# Patient Record
Sex: Male | Born: 1950 | Race: White | Hispanic: No | Marital: Married | Smoking: Never smoker
Health system: Southern US, Community
[De-identification: ages and names within clinical notes are randomized; demographics above are authoritative.]

## PROBLEM LIST (undated history)

## (undated) DIAGNOSIS — L723 Sebaceous cyst: Secondary | ICD-10-CM

## (undated) DIAGNOSIS — Z9010 Acquired absence of unspecified breast and nipple: Secondary | ICD-10-CM

## (undated) DIAGNOSIS — J45909 Unspecified asthma, uncomplicated: Secondary | ICD-10-CM

## (undated) DIAGNOSIS — Z800 Family history of malignant neoplasm of digestive organs: Secondary | ICD-10-CM

## (undated) DIAGNOSIS — E7800 Pure hypercholesterolemia, unspecified: Secondary | ICD-10-CM

## (undated) DIAGNOSIS — R6889 Other general symptoms and signs: Secondary | ICD-10-CM

## (undated) DIAGNOSIS — H9190 Unspecified hearing loss, unspecified ear: Secondary | ICD-10-CM

## (undated) DIAGNOSIS — E785 Hyperlipidemia, unspecified: Secondary | ICD-10-CM

## (undated) DIAGNOSIS — Z8249 Family history of ischemic heart disease and other diseases of the circulatory system: Secondary | ICD-10-CM

## (undated) HISTORY — PX: TONSILLECTOMY: SUR1361

## (undated) HISTORY — DX: Unspecified asthma, uncomplicated: J45.909

## (undated) HISTORY — DX: Acquired absence of unspecified breast and nipple: Z90.10

## (undated) HISTORY — DX: Unspecified hearing loss, unspecified ear: H91.90

## (undated) HISTORY — PX: COLONOSCOPY: SHX174

## (undated) HISTORY — DX: Sebaceous cyst: L72.3

## (undated) HISTORY — DX: Other general symptoms and signs: R68.89

## (undated) HISTORY — PX: CYST REMOVAL HAND: SHX6279

## (undated) HISTORY — DX: Family history of ischemic heart disease and other diseases of the circulatory system: Z82.49

## (undated) HISTORY — DX: Family history of malignant neoplasm of digestive organs: Z80.0

## (undated) HISTORY — DX: Pure hypercholesterolemia, unspecified: E78.00

## (undated) HISTORY — PX: VASECTOMY: SHX75

## (undated) HISTORY — DX: Hyperlipidemia, unspecified: E78.5

---

## 1985-06-28 HISTORY — PX: INGUINAL HERNIA REPAIR: SUR1180

## 2000-01-08 ENCOUNTER — Encounter: Payer: Self-pay | Admitting: Surgery

## 2000-01-08 ENCOUNTER — Encounter: Admission: RE | Admit: 2000-01-08 | Discharge: 2000-01-08 | Payer: Self-pay | Admitting: Surgery

## 2000-01-29 ENCOUNTER — Ambulatory Visit (HOSPITAL_BASED_OUTPATIENT_CLINIC_OR_DEPARTMENT_OTHER): Admission: RE | Admit: 2000-01-29 | Discharge: 2000-01-29 | Payer: Self-pay | Admitting: *Deleted

## 2004-01-30 ENCOUNTER — Ambulatory Visit (HOSPITAL_COMMUNITY): Admission: RE | Admit: 2004-01-30 | Discharge: 2004-01-30 | Payer: Self-pay | Admitting: Gastroenterology

## 2013-07-12 ENCOUNTER — Encounter: Payer: Self-pay | Admitting: Internal Medicine

## 2013-07-19 ENCOUNTER — Telehealth: Payer: Self-pay | Admitting: Internal Medicine

## 2013-07-19 NOTE — Telephone Encounter (Signed)
lmom for pt to call back

## 2013-07-19 NOTE — Telephone Encounter (Signed)
Pt was answering the questions on his form prior to his PV and it asked if he'd had a COLON and he has. Pt saw Dr Earle Gell at [facility] for years and has had countless colonoscopies. His sister died of COLON Cancer when he was a boy and he used to get them Q3 years and then he was done Q5 years. Informed him I will send this to the [facility] nurse and to call for further questions.

## 2013-08-20 ENCOUNTER — Telehealth: Payer: Self-pay | Admitting: *Deleted

## 2013-08-20 ENCOUNTER — Encounter: Payer: Self-pay | Admitting: Internal Medicine

## 2013-08-20 ENCOUNTER — Ambulatory Visit (AMBULATORY_SURGERY_CENTER): Payer: Self-pay | Admitting: *Deleted

## 2013-08-20 VITALS — Wt 183.6 lb

## 2013-08-20 DIAGNOSIS — Z800 Family history of malignant neoplasm of digestive organs: Secondary | ICD-10-CM

## 2013-08-20 MED ORDER — PREPOPIK 10-3.5-12 MG-GM-GM PO PACK: PACK | ORAL | Status: DC

## 2013-08-20 NOTE — Telephone Encounter (Signed)
Patient is a direct colonoscopy on 09-03-13. 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 Patient states he was getting colonoscopies every 3 years but now it was changed to every 5 years. He states last colonoscopy was 5 years ago with Dr.Martin Wynetta Emery. Patient signed release of information form. Given to [facility].

## 2013-08-20 NOTE — Progress Notes (Signed)
Patient has had previous colonoscopies by Dr.Martin Wynetta Emery. Release of information form signed and given to Stacy,CMA.

## 2013-08-21 NOTE — Telephone Encounter (Signed)
Faxed record request to Dr. Earle Gell

## 2013-09-03 ENCOUNTER — Encounter: Payer: Self-pay | Admitting: Internal Medicine

## 2013-09-03 ENCOUNTER — Ambulatory Visit (AMBULATORY_SURGERY_CENTER): Payer: BC Managed Care – PPO | Admitting: Internal Medicine

## 2013-09-03 VITALS — BP 124/60 | HR 54 | Temp 96.5°F | Resp 11 | Ht 71.0 in | Wt 183.0 lb

## 2013-09-03 DIAGNOSIS — D126 Benign neoplasm of colon, unspecified: Secondary | ICD-10-CM

## 2013-09-03 DIAGNOSIS — Z860100 Personal history of colon polyps, unspecified: Secondary | ICD-10-CM

## 2013-09-03 DIAGNOSIS — Z800 Family history of malignant neoplasm of digestive organs: Secondary | ICD-10-CM

## 2013-09-03 DIAGNOSIS — Z1211 Encounter for screening for malignant neoplasm of colon: Secondary | ICD-10-CM

## 2013-09-03 MED ORDER — SODIUM CHLORIDE 0.9 % IV SOLN: 500.0000 mL | INTRAVENOUS | Status: DC

## 2013-09-03 NOTE — Progress Notes (Signed)
Called to room to assist during endoscopic procedure.  Patient ID and intended procedure confirmed with present staff. Received instructions for my participation in the procedure from the performing physician.  

## 2013-09-03 NOTE — Op Note (Signed)
[facility]  [address]. [place], [address]   COLONOSCOPY PROCEDURE REPORT  PATIENT: Jonathan Strickland, Jonathan Strickland  MR#: [id number] BIRTHDATE: 08/21/1950 , 29  yrs. old GENDER: Male ENDOSCOPIST: Jerene Bears, MD REFERRED BY:Hal Felipa Eth, M.D. PROCEDURE DATE:  09/03/2013 PROCEDURE:   Colonoscopy with snare polypectomy First Screening Colonoscopy - Avg.  risk and is 50 yrs.  old or older - No.  Prior Negative Screening - Now for repeat screening. N/A  History of Adenoma - Now for follow-up colonoscopy & has been > or = to 3 yrs.  Yes hx of adenoma.  Has been 3 or more years since last colonoscopy.  Polyps Removed Today? Yes. ASA CLASS:   Class II INDICATIONS:elevated risk screening, Patient's personal history of colon polyps, and Patient's family history of colon cancer, distant relatives. MEDICATIONS: MAC sedation, administered by CRNA and propofol (Diprivan) 350mg  IV  DESCRIPTION OF PROCEDURE:   After the risks benefits and alternatives of the procedure were thoroughly explained, informed consent was obtained.  A digital rectal exam revealed no rectal mass.   The [id number]  endoscope was introduced through the anus and advanced to the cecum, which was identified by both the appendix and ileocecal valve. No adverse events experienced. The quality of the prep was good, using MoviPrep  The instrument was then slowly withdrawn as the colon was fully examined.   COLON FINDINGS: Two sessile polyps measuring 5 and 7 mm in size were found in the transverse colon and descending colon.  Polypectomy was performed using cold snare.  All resections were complete and all polyp tissue was completely retrieved.   There was moderate diverticulosis noted in the ascending colon, at the hepatic flexure, in the descending colon, and sigmoid colon with associated muscular hypertrophy and angulation.  Retroflexed views revealed no abnormalities. The time to cecum=7 minutes 33  seconds.  Withdrawal time=20 minutes 13 seconds.  The scope was withdrawn and the procedure completed. COMPLICATIONS: There were no complications.  ENDOSCOPIC IMPRESSION: 1.   Two sessile polyps measuring 5 and 7 mm in size were found in the transverse colon and descending colon; Polypectomy was performed using cold snare 2.   There was moderate diverticulosis noted in the ascending colon, at the hepatic flexure, in the descending colon, and sigmoid colon  RECOMMENDATIONS: 1.  Await pathology results 2.  High fiber diet 3.  Timing of repeat colonoscopy will be determined by pathology findings, but no longer than 5 years. 4.  You will receive a letter within 1-2 weeks with the results of your biopsy as well as final recommendations.  Please call my office if you have not received a letter after 3 weeks.   eSigned:  Jerene Bears, MD 09/03/2013 10:42 AM   cc: The Patient and Lajean Manes, MD   PATIENT NAME:  Jonathan Strickland, Jonathan Strickland MR#: [id number]

## 2013-09-03 NOTE — Progress Notes (Signed)
Report to pacu rn, vss, bbs=clear 

## 2013-09-03 NOTE — Patient Instructions (Signed)
YOU HAD AN ENDOSCOPIC PROCEDURE TODAY AT THE [facility]: Refer to the procedure report that was given to you for any specific questions about what was found during the examination.  If the procedure report does not answer your questions, please call your gastroenterologist to clarify.  If you requested that your care partner not be given the details of your procedure findings, then the procedure report has been included in a sealed envelope for you to review at your convenience later.  YOU SHOULD EXPECT: Some feelings of bloating in the abdomen. Passage of more gas than usual.  Walking can help get rid of the air that was put into your GI tract during the procedure and reduce the bloating. If you had a lower endoscopy (such as a colonoscopy or flexible sigmoidoscopy) you may notice spotting of blood in your stool or on the toilet paper. If you underwent a bowel prep for your procedure, then you may not have a normal bowel movement for a few days.  DIET: Your first meal following the procedure should be a light meal and then it is ok to progress to your normal diet.  A half-sandwich or bowl of soup is an example of a good first meal.  Heavy or fried foods are harder to digest and may make you feel nauseous or bloated.  Likewise meals heavy in dairy and vegetables can cause extra gas to form and this can also increase the bloating.  Drink plenty of fluids but you should avoid alcoholic beverages for 24 hours.  ACTIVITY: Your care partner should take you home directly after the procedure.  You should plan to take it easy, moving slowly for the rest of the day.  You can resume normal activity the day after the procedure however you should NOT DRIVE or use heavy machinery for 24 hours (because of the sedation medicines used during the test).    SYMPTOMS TO REPORT IMMEDIATELY: A gastroenterologist can be reached at any hour.  During normal business hours, 8:30 AM to 5:00 PM Monday through Friday,  call [phone].  After hours and on weekends, please call the GI answering service at [phone] who will take a message and have the physician on call contact you.   Following lower endoscopy (colonoscopy or flexible sigmoidoscopy):  Excessive amounts of blood in the stool  Significant tenderness or worsening of abdominal pains  Swelling of the abdomen that is new, acute  Fever of 100F or higher  FOLLOW UP: If any biopsies were taken you will be contacted by phone or by letter within the next 1-3 weeks.  Call your gastroenterologist if you have not heard about the biopsies in 3 weeks.  Our staff will call the home number listed on your records the next business day following your procedure to check on you and address any questions or concerns that you may have at that time regarding the information given to you following your procedure. This is a courtesy call and so if there is no answer at the home number and we have not heard from you through the emergency physician on call, we will assume that you have returned to your regular daily activities without incident.  SIGNATURES/CONFIDENTIALITY: You and/or your care partner have signed paperwork which will be entered into your electronic medical record.  These signatures attest to the fact that that the information above on your After Visit Summary has been reviewed and is understood.  Full responsibility of the confidentiality of this   discharge information lies with you and/or your care-partner.  Polyps, Diverticulosis, high fiber diet-handouts given  Repeat colonoscopy will be determined by pathology.  

## 2013-09-04 ENCOUNTER — Telehealth: Payer: Self-pay | Admitting: *Deleted

## 2013-09-04 NOTE — Telephone Encounter (Signed)
  Follow up Call-  Call back number 09/03/2013  Post procedure Call Back phone  # [phone]  Permission to leave phone message Yes     Patient questions:  Do you have a fever, pain , or abdominal swelling? no Pain Score  0 *  Have you tolerated food without any problems? yes  Have you been able to return to your normal activities? yes  Do you have any questions about your discharge instructions: Diet   no Medications  no Follow up visit  no  Do you have questions or concerns about your Care? no  Actions: * If pain score is 4 or above: No action needed, pain <4.

## 2013-09-12 ENCOUNTER — Encounter: Payer: Self-pay | Admitting: Internal Medicine

## 2014-04-27 ENCOUNTER — Encounter: Payer: Self-pay | Admitting: *Deleted

## 2014-07-23 ENCOUNTER — Other Ambulatory Visit: Payer: Self-pay | Admitting: Dermatology

## 2015-06-03 DIAGNOSIS — D1030 Benign neoplasm of unspecified part of mouth: Secondary | ICD-10-CM | POA: Diagnosis not present

## 2015-06-18 DIAGNOSIS — L820 Inflamed seborrheic keratosis: Secondary | ICD-10-CM | POA: Diagnosis not present

## 2015-06-18 DIAGNOSIS — D2262 Melanocytic nevi of left upper limb, including shoulder: Secondary | ICD-10-CM | POA: Diagnosis not present

## 2015-06-18 DIAGNOSIS — D2272 Melanocytic nevi of left lower limb, including hip: Secondary | ICD-10-CM | POA: Diagnosis not present

## 2015-06-18 DIAGNOSIS — L218 Other seborrheic dermatitis: Secondary | ICD-10-CM | POA: Diagnosis not present

## 2015-06-18 DIAGNOSIS — D1801 Hemangioma of skin and subcutaneous tissue: Secondary | ICD-10-CM | POA: Diagnosis not present

## 2015-06-18 DIAGNOSIS — L814 Other melanin hyperpigmentation: Secondary | ICD-10-CM | POA: Diagnosis not present

## 2015-06-18 DIAGNOSIS — L738 Other specified follicular disorders: Secondary | ICD-10-CM | POA: Diagnosis not present

## 2015-06-18 DIAGNOSIS — L821 Other seborrheic keratosis: Secondary | ICD-10-CM | POA: Diagnosis not present

## 2015-06-18 DIAGNOSIS — L718 Other rosacea: Secondary | ICD-10-CM | POA: Diagnosis not present

## 2015-06-18 DIAGNOSIS — D2361 Other benign neoplasm of skin of right upper limb, including shoulder: Secondary | ICD-10-CM | POA: Diagnosis not present

## 2015-06-18 DIAGNOSIS — Z85828 Personal history of other malignant neoplasm of skin: Secondary | ICD-10-CM | POA: Diagnosis not present

## 2015-06-18 DIAGNOSIS — D2261 Melanocytic nevi of right upper limb, including shoulder: Secondary | ICD-10-CM | POA: Diagnosis not present

## 2015-10-22 DIAGNOSIS — Z1389 Encounter for screening for other disorder: Secondary | ICD-10-CM | POA: Diagnosis not present

## 2015-10-22 DIAGNOSIS — Z79899 Other long term (current) drug therapy: Secondary | ICD-10-CM | POA: Diagnosis not present

## 2015-10-22 DIAGNOSIS — E7800 Pure hypercholesterolemia, unspecified: Secondary | ICD-10-CM | POA: Diagnosis not present

## 2015-10-22 DIAGNOSIS — Z0000 Encounter for general adult medical examination without abnormal findings: Secondary | ICD-10-CM | POA: Diagnosis not present

## 2015-10-28 DIAGNOSIS — Z79899 Other long term (current) drug therapy: Secondary | ICD-10-CM | POA: Diagnosis not present

## 2015-10-28 DIAGNOSIS — Z125 Encounter for screening for malignant neoplasm of prostate: Secondary | ICD-10-CM | POA: Diagnosis not present

## 2015-10-28 DIAGNOSIS — E7800 Pure hypercholesterolemia, unspecified: Secondary | ICD-10-CM | POA: Diagnosis not present

## 2015-10-28 DIAGNOSIS — Z0000 Encounter for general adult medical examination without abnormal findings: Secondary | ICD-10-CM | POA: Diagnosis not present

## 2015-10-28 DIAGNOSIS — Z23 Encounter for immunization: Secondary | ICD-10-CM | POA: Diagnosis not present

## 2016-03-27 DIAGNOSIS — J101 Influenza due to other identified influenza virus with other respiratory manifestations: Secondary | ICD-10-CM | POA: Diagnosis not present

## 2016-06-14 DIAGNOSIS — D2272 Melanocytic nevi of left lower limb, including hip: Secondary | ICD-10-CM | POA: Diagnosis not present

## 2016-06-14 DIAGNOSIS — D1801 Hemangioma of skin and subcutaneous tissue: Secondary | ICD-10-CM | POA: Diagnosis not present

## 2016-06-14 DIAGNOSIS — D2372 Other benign neoplasm of skin of left lower limb, including hip: Secondary | ICD-10-CM | POA: Diagnosis not present

## 2016-06-14 DIAGNOSIS — D2261 Melanocytic nevi of right upper limb, including shoulder: Secondary | ICD-10-CM | POA: Diagnosis not present

## 2016-06-14 DIAGNOSIS — D2262 Melanocytic nevi of left upper limb, including shoulder: Secondary | ICD-10-CM | POA: Diagnosis not present

## 2016-06-14 DIAGNOSIS — L821 Other seborrheic keratosis: Secondary | ICD-10-CM | POA: Diagnosis not present

## 2016-06-14 DIAGNOSIS — L738 Other specified follicular disorders: Secondary | ICD-10-CM | POA: Diagnosis not present

## 2016-08-02 DIAGNOSIS — Z79899 Other long term (current) drug therapy: Secondary | ICD-10-CM | POA: Diagnosis not present

## 2016-08-02 DIAGNOSIS — E7800 Pure hypercholesterolemia, unspecified: Secondary | ICD-10-CM | POA: Diagnosis not present

## 2016-11-08 DIAGNOSIS — Z79899 Other long term (current) drug therapy: Secondary | ICD-10-CM | POA: Diagnosis not present

## 2016-11-08 DIAGNOSIS — E7800 Pure hypercholesterolemia, unspecified: Secondary | ICD-10-CM | POA: Diagnosis not present

## 2016-11-08 DIAGNOSIS — H6123 Impacted cerumen, bilateral: Secondary | ICD-10-CM | POA: Diagnosis not present

## 2016-11-08 DIAGNOSIS — Z1389 Encounter for screening for other disorder: Secondary | ICD-10-CM | POA: Diagnosis not present

## 2016-11-08 DIAGNOSIS — Z125 Encounter for screening for malignant neoplasm of prostate: Secondary | ICD-10-CM | POA: Diagnosis not present

## 2016-11-08 DIAGNOSIS — Z23 Encounter for immunization: Secondary | ICD-10-CM | POA: Diagnosis not present

## 2016-11-08 DIAGNOSIS — Z0000 Encounter for general adult medical examination without abnormal findings: Secondary | ICD-10-CM | POA: Diagnosis not present

## 2016-11-09 DIAGNOSIS — H6123 Impacted cerumen, bilateral: Secondary | ICD-10-CM | POA: Diagnosis not present

## 2017-01-25 ENCOUNTER — Other Ambulatory Visit: Payer: Self-pay | Admitting: Geriatric Medicine

## 2017-01-25 DIAGNOSIS — R1030 Lower abdominal pain, unspecified: Secondary | ICD-10-CM

## 2017-01-28 ENCOUNTER — Ambulatory Visit
Admission: RE | Admit: 2017-01-28 | Discharge: 2017-01-28 | Disposition: A | Discharge status: Home / Self Care | Payer: Medicare Other | Source: Ambulatory Visit | Attending: Geriatric Medicine | Admitting: Geriatric Medicine

## 2017-01-28 DIAGNOSIS — R1030 Lower abdominal pain, unspecified: Secondary | ICD-10-CM

## 2017-01-28 DIAGNOSIS — K5732 Diverticulitis of large intestine without perforation or abscess without bleeding: Secondary | ICD-10-CM | POA: Diagnosis not present

## 2017-01-28 IMAGING — CT CT ABD-PELV W/ CM
3 of 5 series · 12 of 36 positions shown, 18 images · IV contrast (iopamidol)
Comparison: None.

CLINICAL DATA: Lower abdominal pain with recent fever. History of
diverticulitis.

Creatinine was obtained on site at [HOSPITAL] at [REDACTED].
Results: Creatinine 1.0 mg/dL.
EXAM:
CT ABDOMEN AND PELVIS WITH CONTRAST
TECHNIQUE: Multidetector CT imaging of the abdomen and pelvis was performed
using the standard protocol following bolus administration of
intravenous contrast.
CONTRAST:  100mL [7Q] IOPAMIDOL ([7Q]) INJECTION 61%

[Series 3: abd/pelvis with · axial · 0.75mm/px · z∈[-376,-16]mm · 8 of 94 slices shown, 13 images]
[im 11/94  soft-tissue]
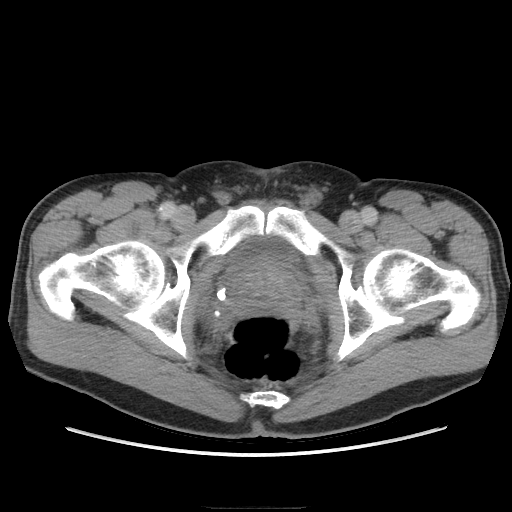
[im 11/94  bone]
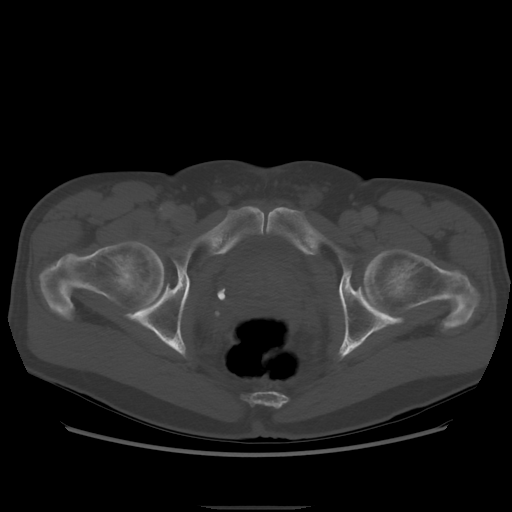
[im 21/94  soft-tissue]
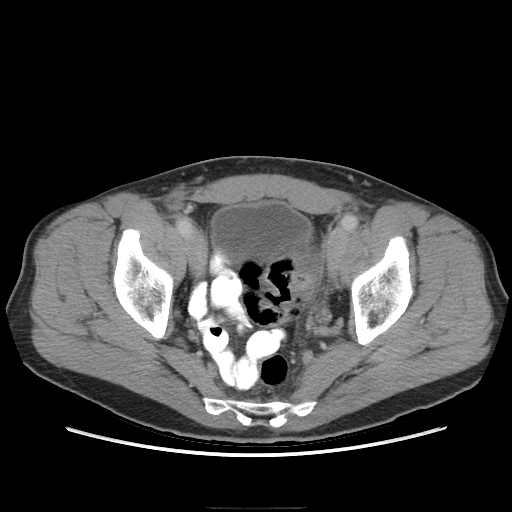
[im 32/94  soft-tissue]
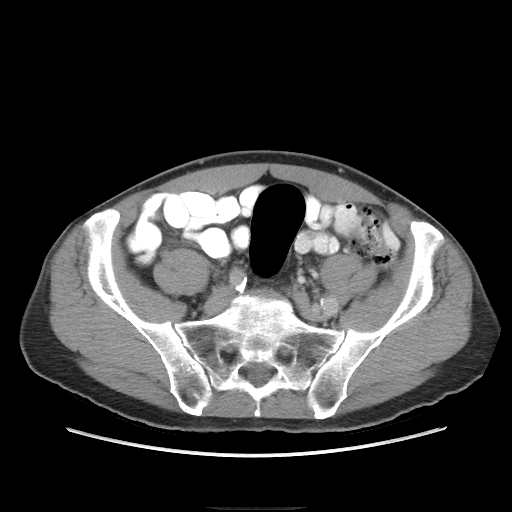
[im 42/94  soft-tissue]
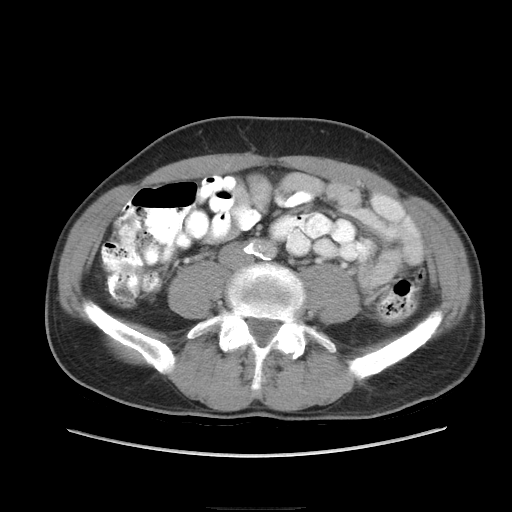
[im 52/94  soft-tissue]
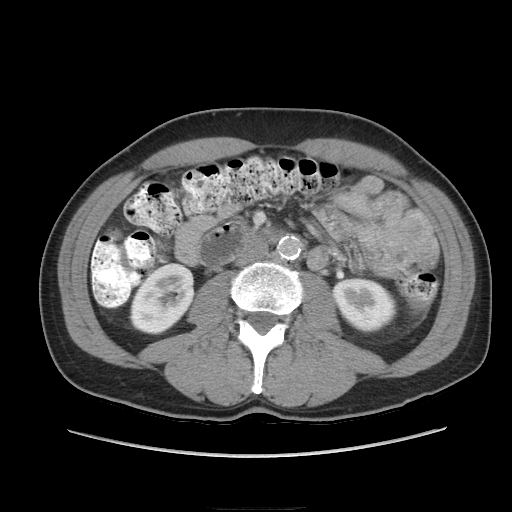
[im 52/94  lung]
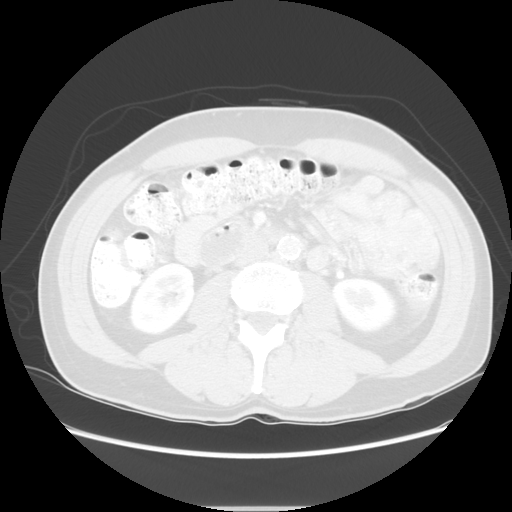
[im 63/94  soft-tissue]
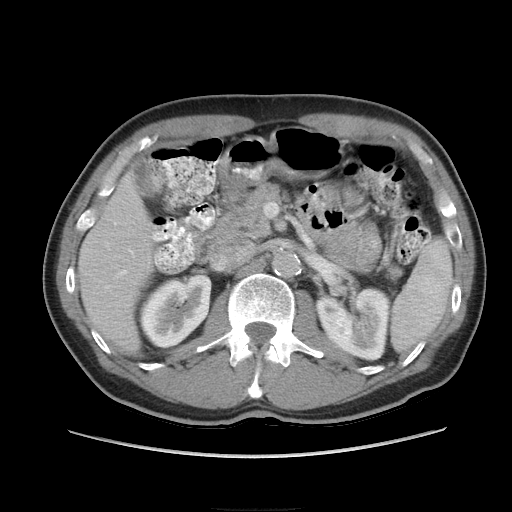
[im 63/94  lung]
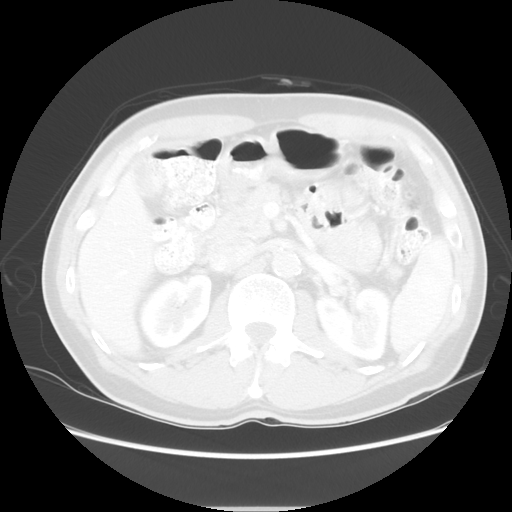
[im 73/94  soft-tissue]
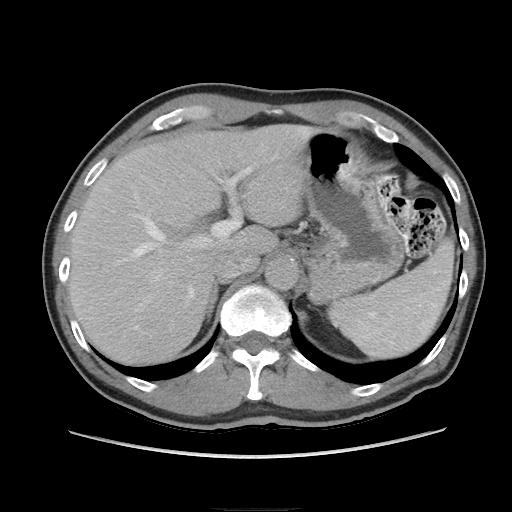
[im 73/94  lung]
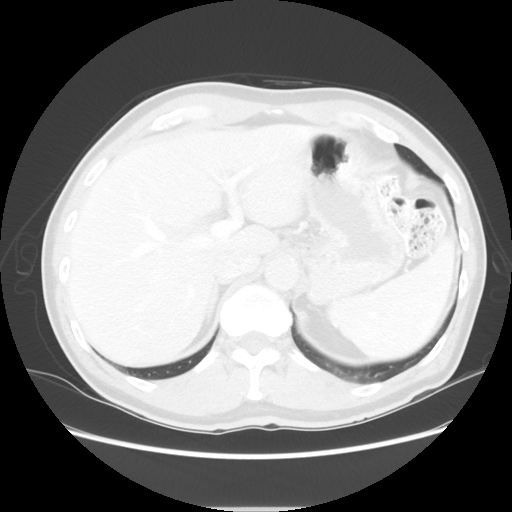
[im 83/94  soft-tissue]
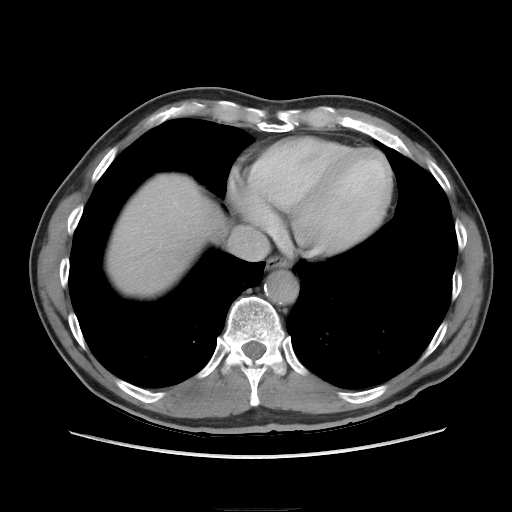
[im 83/94  lung]
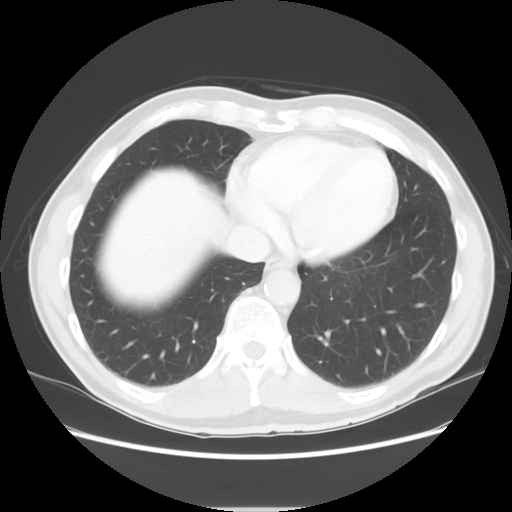

[Series 601: coronal body · coronal · 1.02mm/px · 1 of 112 slices shown, 2 images]
[im 38/112  soft-tissue]
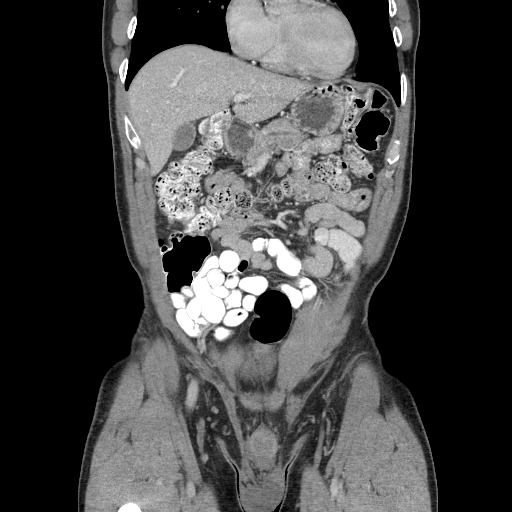
[im 38/112  bone]
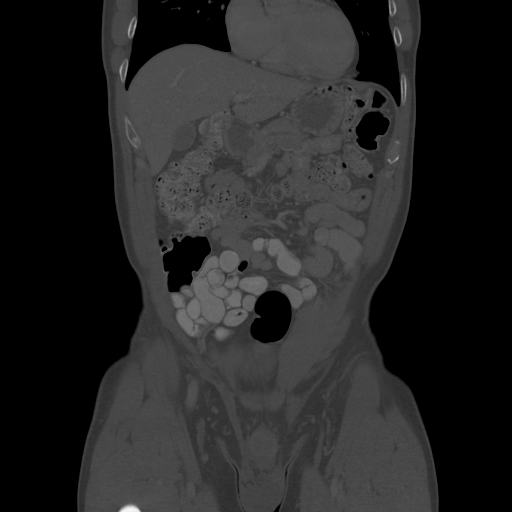

[Series 602: sagittal body · sagittal · 1.02mm/px · 3 of 154 slices shown]
[im 11/154  soft-tissue]
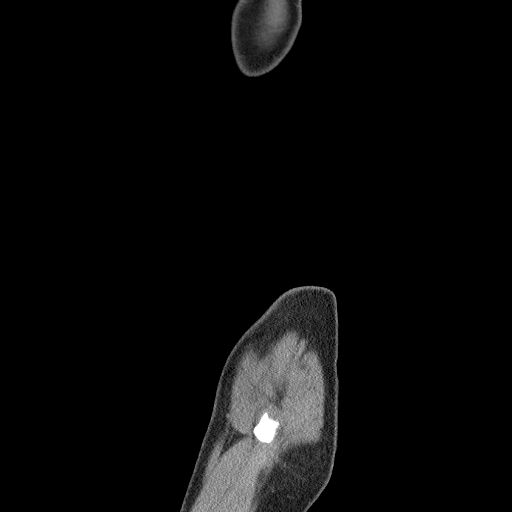
[im 33/154  soft-tissue]
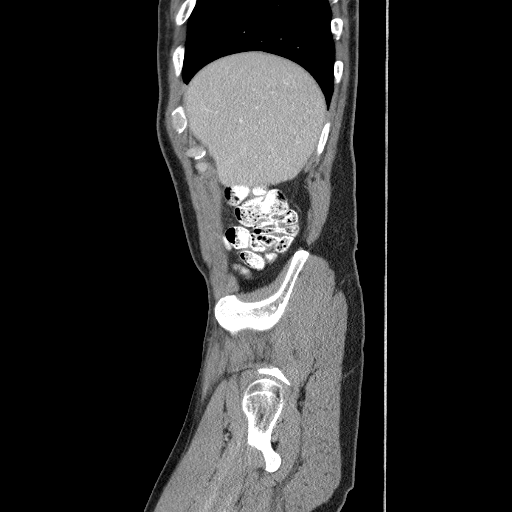
[im 55/154  soft-tissue]
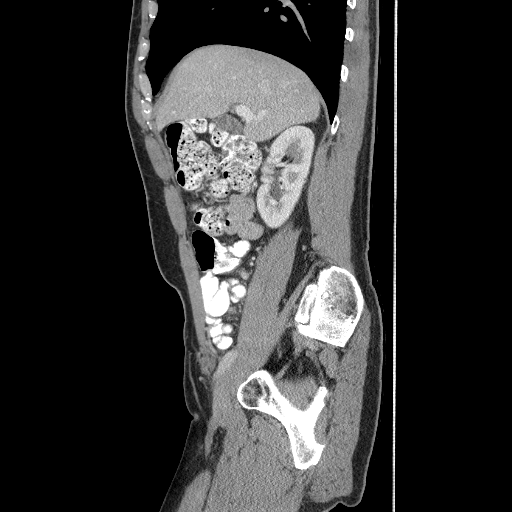

[12 of 36 positions shown; findings below may reference images not displayed]

FINDINGS: Lower chest: Clear lung bases. Partially visualized coronary artery
atherosclerosis. Normal heart size.

Hepatobiliary: No focal liver abnormality is seen. No gallstones,
gallbladder wall thickening, or biliary dilatation.

Pancreas: Unremarkable.

Spleen: Unremarkable.

Adrenals/Urinary Tract: Unremarkable adrenal glands. No evidence
renal mass, calculi, or hydronephrosis. Unremarkable bladder.

Stomach/Bowel: The stomach is within normal limits. There is no
evidence of bowel obstruction. There is colonic diverticulosis.
Moderate wall thickening and mild pericolonic stranding are noted in
the proximal and mid sigmoid colon. There is also a separate, more
focal area of eccentric posterior wall thickening in the distal
descending colon with mild surrounding stranding. No frank
perforation or abscess is identified.

Vascular/Lymphatic: Aortoiliac atherosclerosis without aneurysm. No
enlarged lymph nodes.

Reproductive: Moderate prostatic enlargement.

Other: No intraperitoneal free fluid. No abdominal wall mass or
hernia.

Musculoskeletal: No acute osseous abnormality or suspicious osseous
lesion.
IMPRESSION: 1. Colonic diverticulosis with evidence of acute uncomplicated
diverticulitis in the sigmoid colon.
2. Focal wall thickening and mild surrounding inflammation in the
distal descending colon also likely reflecting acute diverticulitis,
less likely neoplasm. Correlate with colonoscopy history.
3.  Aortic Atherosclerosis ([7Q]-[7Q]).

## 2017-01-28 MED ORDER — IOPAMIDOL (ISOVUE-300) INJECTION 61%
100.0000 mL | Freq: Once | INTRAVENOUS | Status: AC | PRN
  Administered 2017-01-28: 100 mL via INTRAVENOUS

## 2017-03-11 DIAGNOSIS — Z23 Encounter for immunization: Secondary | ICD-10-CM | POA: Diagnosis not present

## 2017-06-14 DIAGNOSIS — D2271 Melanocytic nevi of right lower limb, including hip: Secondary | ICD-10-CM | POA: Diagnosis not present

## 2017-06-14 DIAGNOSIS — Z85828 Personal history of other malignant neoplasm of skin: Secondary | ICD-10-CM | POA: Diagnosis not present

## 2017-06-14 DIAGNOSIS — L821 Other seborrheic keratosis: Secondary | ICD-10-CM | POA: Diagnosis not present

## 2017-06-14 DIAGNOSIS — L738 Other specified follicular disorders: Secondary | ICD-10-CM | POA: Diagnosis not present

## 2017-06-14 DIAGNOSIS — D2272 Melanocytic nevi of left lower limb, including hip: Secondary | ICD-10-CM | POA: Diagnosis not present

## 2017-06-14 DIAGNOSIS — D1801 Hemangioma of skin and subcutaneous tissue: Secondary | ICD-10-CM | POA: Diagnosis not present

## 2017-06-14 DIAGNOSIS — D2372 Other benign neoplasm of skin of left lower limb, including hip: Secondary | ICD-10-CM | POA: Diagnosis not present

## 2017-11-22 DIAGNOSIS — I700 Atherosclerosis of aorta: Secondary | ICD-10-CM | POA: Diagnosis not present

## 2017-11-22 DIAGNOSIS — Z79899 Other long term (current) drug therapy: Secondary | ICD-10-CM | POA: Diagnosis not present

## 2017-11-22 DIAGNOSIS — Z125 Encounter for screening for malignant neoplasm of prostate: Secondary | ICD-10-CM | POA: Diagnosis not present

## 2017-11-22 DIAGNOSIS — Z1389 Encounter for screening for other disorder: Secondary | ICD-10-CM | POA: Diagnosis not present

## 2017-11-22 DIAGNOSIS — Z0000 Encounter for general adult medical examination without abnormal findings: Secondary | ICD-10-CM | POA: Diagnosis not present

## 2017-11-22 DIAGNOSIS — E7800 Pure hypercholesterolemia, unspecified: Secondary | ICD-10-CM | POA: Diagnosis not present

## 2018-01-27 DIAGNOSIS — S9002XA Contusion of left ankle, initial encounter: Secondary | ICD-10-CM | POA: Diagnosis not present

## 2018-02-26 DIAGNOSIS — S9002XD Contusion of left ankle, subsequent encounter: Secondary | ICD-10-CM | POA: Diagnosis not present

## 2018-02-26 DIAGNOSIS — L97321 Non-pressure chronic ulcer of left ankle limited to breakdown of skin: Secondary | ICD-10-CM | POA: Diagnosis not present

## 2018-03-07 DIAGNOSIS — Z23 Encounter for immunization: Secondary | ICD-10-CM | POA: Diagnosis not present

## 2018-03-22 DIAGNOSIS — R972 Elevated prostate specific antigen [PSA]: Secondary | ICD-10-CM | POA: Diagnosis not present

## 2018-06-14 DIAGNOSIS — L738 Other specified follicular disorders: Secondary | ICD-10-CM | POA: Diagnosis not present

## 2018-06-14 DIAGNOSIS — D2272 Melanocytic nevi of left lower limb, including hip: Secondary | ICD-10-CM | POA: Diagnosis not present

## 2018-06-14 DIAGNOSIS — L821 Other seborrheic keratosis: Secondary | ICD-10-CM | POA: Diagnosis not present

## 2018-06-14 DIAGNOSIS — D2372 Other benign neoplasm of skin of left lower limb, including hip: Secondary | ICD-10-CM | POA: Diagnosis not present

## 2018-06-14 DIAGNOSIS — L570 Actinic keratosis: Secondary | ICD-10-CM | POA: Diagnosis not present

## 2018-08-09 ENCOUNTER — Encounter: Payer: Self-pay | Admitting: *Deleted

## 2018-09-10 ENCOUNTER — Encounter: Payer: Self-pay | Admitting: Internal Medicine

## 2018-11-28 DIAGNOSIS — Z79899 Other long term (current) drug therapy: Secondary | ICD-10-CM | POA: Diagnosis not present

## 2018-11-28 DIAGNOSIS — Z0000 Encounter for general adult medical examination without abnormal findings: Secondary | ICD-10-CM | POA: Diagnosis not present

## 2018-11-28 DIAGNOSIS — E7800 Pure hypercholesterolemia, unspecified: Secondary | ICD-10-CM | POA: Diagnosis not present

## 2018-11-28 DIAGNOSIS — Z1389 Encounter for screening for other disorder: Secondary | ICD-10-CM | POA: Diagnosis not present

## 2018-11-28 DIAGNOSIS — I700 Atherosclerosis of aorta: Secondary | ICD-10-CM | POA: Diagnosis not present

## 2018-11-28 DIAGNOSIS — D126 Benign neoplasm of colon, unspecified: Secondary | ICD-10-CM | POA: Diagnosis not present

## 2018-12-11 DIAGNOSIS — E7800 Pure hypercholesterolemia, unspecified: Secondary | ICD-10-CM | POA: Diagnosis not present

## 2018-12-11 DIAGNOSIS — Z0000 Encounter for general adult medical examination without abnormal findings: Secondary | ICD-10-CM | POA: Diagnosis not present

## 2018-12-11 DIAGNOSIS — Z79899 Other long term (current) drug therapy: Secondary | ICD-10-CM | POA: Diagnosis not present

## 2018-12-11 DIAGNOSIS — Z125 Encounter for screening for malignant neoplasm of prostate: Secondary | ICD-10-CM | POA: Diagnosis not present

## 2019-03-15 DIAGNOSIS — K5792 Diverticulitis of intestine, part unspecified, without perforation or abscess without bleeding: Secondary | ICD-10-CM | POA: Diagnosis not present

## 2019-03-15 DIAGNOSIS — Z86010 Personal history of colonic polyps: Secondary | ICD-10-CM | POA: Diagnosis not present

## 2019-03-31 DIAGNOSIS — Z23 Encounter for immunization: Secondary | ICD-10-CM | POA: Diagnosis not present

## 2019-04-27 ENCOUNTER — Other Ambulatory Visit: Payer: Self-pay

## 2019-04-27 DIAGNOSIS — Z20822 Contact with and (suspected) exposure to covid-19: Secondary | ICD-10-CM

## 2019-04-29 LAB — NOVEL CORONAVIRUS, NAA: SARS-CoV-2, NAA: NOT DETECTED

## 2019-11-29 DIAGNOSIS — I700 Atherosclerosis of aorta: Secondary | ICD-10-CM | POA: Diagnosis not present

## 2019-11-29 DIAGNOSIS — E7800 Pure hypercholesterolemia, unspecified: Secondary | ICD-10-CM | POA: Diagnosis not present

## 2019-11-29 DIAGNOSIS — Z0000 Encounter for general adult medical examination without abnormal findings: Secondary | ICD-10-CM | POA: Diagnosis not present

## 2019-11-29 DIAGNOSIS — Z1389 Encounter for screening for other disorder: Secondary | ICD-10-CM | POA: Diagnosis not present

## 2019-11-29 DIAGNOSIS — Z79899 Other long term (current) drug therapy: Secondary | ICD-10-CM | POA: Diagnosis not present

## 2019-12-05 DIAGNOSIS — E7800 Pure hypercholesterolemia, unspecified: Secondary | ICD-10-CM | POA: Diagnosis not present

## 2019-12-05 DIAGNOSIS — Z79899 Other long term (current) drug therapy: Secondary | ICD-10-CM | POA: Diagnosis not present

## 2020-02-04 DIAGNOSIS — K648 Other hemorrhoids: Secondary | ICD-10-CM | POA: Diagnosis not present

## 2020-02-04 DIAGNOSIS — Z86010 Personal history of colonic polyps: Secondary | ICD-10-CM | POA: Diagnosis not present

## 2020-02-04 DIAGNOSIS — K5730 Diverticulosis of large intestine without perforation or abscess without bleeding: Secondary | ICD-10-CM | POA: Diagnosis not present

## 2020-02-04 DIAGNOSIS — Z800 Family history of malignant neoplasm of digestive organs: Secondary | ICD-10-CM | POA: Diagnosis not present

## 2020-02-29 DIAGNOSIS — E7800 Pure hypercholesterolemia, unspecified: Secondary | ICD-10-CM | POA: Diagnosis not present

## 2020-03-21 DIAGNOSIS — Z23 Encounter for immunization: Secondary | ICD-10-CM | POA: Diagnosis not present

## 2020-04-14 DIAGNOSIS — Z23 Encounter for immunization: Secondary | ICD-10-CM | POA: Diagnosis not present

## 2020-06-16 DIAGNOSIS — L821 Other seborrheic keratosis: Secondary | ICD-10-CM | POA: Diagnosis not present

## 2020-06-16 DIAGNOSIS — D1801 Hemangioma of skin and subcutaneous tissue: Secondary | ICD-10-CM | POA: Diagnosis not present

## 2020-06-16 DIAGNOSIS — L738 Other specified follicular disorders: Secondary | ICD-10-CM | POA: Diagnosis not present

## 2020-06-16 DIAGNOSIS — D2261 Melanocytic nevi of right upper limb, including shoulder: Secondary | ICD-10-CM | POA: Diagnosis not present

## 2020-06-16 DIAGNOSIS — D2361 Other benign neoplasm of skin of right upper limb, including shoulder: Secondary | ICD-10-CM | POA: Diagnosis not present

## 2020-06-16 DIAGNOSIS — D2262 Melanocytic nevi of left upper limb, including shoulder: Secondary | ICD-10-CM | POA: Diagnosis not present

## 2020-07-01 DIAGNOSIS — Z79899 Other long term (current) drug therapy: Secondary | ICD-10-CM | POA: Diagnosis not present

## 2020-07-01 DIAGNOSIS — G4733 Obstructive sleep apnea (adult) (pediatric): Secondary | ICD-10-CM | POA: Diagnosis not present

## 2020-07-01 DIAGNOSIS — I499 Cardiac arrhythmia, unspecified: Secondary | ICD-10-CM | POA: Diagnosis not present

## 2020-07-01 DIAGNOSIS — I480 Paroxysmal atrial fibrillation: Secondary | ICD-10-CM | POA: Diagnosis not present

## 2020-07-09 NOTE — Progress Notes (Signed)
Primary Care Physician: Lajean Manes, MD Primary Cardiologist: none Primary Electrophysiologist: none Referring Physician: Dr Betsy Coder is a 70 y.o. male with a history of HLD and atrial fibrillation who presents for consultation in the [facility]. The patient was initially diagnosed with atrial fibrillation 07/01/20 after presenting to his PCP with an alert on his smart watch for afib. ECG confirmed rate controlled afib. Of note, he was diagnosed with COVID 03/2020. Patient was started on Eliquis for a CHADS2VASC score of 1. He does admit that he had more alcohol than usual about 4 days prior to his afib diagnosis. He denies any awareness of his afib. He has completed a sleep study and the results are pending.   Today, he denies symptoms of palpitations, chest pain, shortness of breath, orthopnea, PND, lower extremity edema, dizziness, presyncope, syncope, bleeding, or neurologic sequela. The patient is tolerating medications without difficulties and is otherwise without complaint today.    Atrial Fibrillation Risk Factors:  he does have symptoms or diagnosis of sleep apnea. Sleep study results pending. he does not have a history of rheumatic fever. he does have a history of alcohol use. The patient does have a history of early familial atrial fibrillation or other arrhythmias. Father got PPM age 55s  he has a BMI of Body mass index is 24.77 kg/m.Marland Kitchen Filed Weights   07/10/20 0836  Weight: 80.6 kg    Family History  Problem Relation Age of Onset  . CVA Mother   . Heart attack Father   . Colon cancer Sister 62       colo-recto CA died at 58 y.o.  . Heart attack Brother 36  . Colon cancer Paternal Aunt 61  . Colon cancer Cousin   . Leukemia Sister 4     Atrial Fibrillation Management history:  Previous antiarrhythmic drugs: none Previous cardioversions: none Previous ablations: none CHADS2VASC score: 1 Anticoagulation history:  Eliquis   Past Medical History:  Diagnosis Date  . Asthma    as a child  . ENT complaint   . Family history of early CAD   . Family hx of colon cancer   . H/O mastectomy   . Hearing loss   . Hypercholesterolemia   . Hyperlipidemia   . Sebaceous cyst of left axilla    Past Surgical History:  Procedure Laterality Date  . COLONOSCOPY    . CYST REMOVAL HAND    . INGUINAL HERNIA REPAIR  1987  . TONSILLECTOMY    . VASECTOMY      Current Outpatient Medications  Medication Sig Dispense Refill  . apixaban (ELIQUIS) 5 MG TABS tablet Take 5 mg by mouth 2 (two) times daily.    . Cholecalciferol (VITAMIN D PO) Take 1 tablet by mouth daily.    Marland Kitchen co-enzyme Q-10 30 MG capsule Take 100 mg by mouth daily.    . rosuvastatin (CRESTOR) 10 MG tablet Take 10 mg by mouth daily.     No current facility-administered medications for this encounter.    Allergies  Allergen Reactions  . Lipitor [Atorvastatin]     Elevated liver enzymes    Social History   Socioeconomic History  . Marital status: Married    Spouse name: Not on file  . Number of children: Not on file  . Years of education: Not on file  . Highest education level: Not on file  Occupational History  . Not on file  Tobacco Use  . Smoking status:  Never Smoker  . Smokeless tobacco: Never Used  Substance and Sexual Activity  . Alcohol use: Yes    Alcohol/week: 8.0 standard drinks    Types: 7 Glasses of wine, 1 Standard drinks or equivalent per week    Comment: glass wine with dinner  . Drug use: No  . Sexual activity: Not on file  Other Topics Concern  . Not on file  Social History Narrative   History of smoking cigarettes: former smoker, quit in year 1974, pack-year Hs: 3. No smoking. No alcohol. No recreational drug use. 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   Social Determinants of Health   Financial Resource Strain: Not on file  Food Insecurity: Not on file  Transportation Needs: Not on file   Physical Activity: Not on file  Stress: Not on file  Social Connections: Not on file  Intimate Partner Violence: Not on file     ROS- All systems are reviewed and negative except as per the HPI above.  Physical Exam: Vitals:   07/10/20 0836  BP: 138/72  Pulse: (!) 55  Weight: 80.6 kg  Height: 5\' 11"  (1.803 m)    GEN- The patient is well appearing, alert and oriented x 3 today.   Head- normocephalic, atraumatic Eyes-  Sclera clear, conjunctiva pink Ears- hearing intact Oropharynx- clear Neck- supple  Lungs- Clear to ausculation bilaterally, normal work of breathing Heart- irregular rate and rhythm, no murmurs, rubs or gallops  GI- soft, NT, ND, + BS Extremities- no clubbing, cyanosis, or edema MS- no significant deformity or atrophy Skin- no rash or lesion Psych- euthymic mood, full affect Neuro- strength and sensation are intact  Wt Readings from Last 3 Encounters:  07/10/20 80.6 kg  09/03/13 83 kg  08/20/13 83.3 kg    EKG today demonstrates  Coarse afib Vent. rate 55 BPM QRS duration 88 ms QT/QTc 414/396 ms  Epic records are reviewed at length today  CHA2DS2-VASc Score = 1  The patient's score is based upon: CHF History: No HTN History: No Diabetes History: No Stroke History: No Vascular Disease History: No Age Score: 1 Gender Score: 0      ASSESSMENT AND PLAN: 1. Persistent Atrial Fibrillation (ICD10:  I48.19) The patient's CHA2DS2-VASc score is 1, indicating a 0.6% annual risk of stroke.   General education about afib provided and questions answered. We also discussed his stroke risk and the risks and benefits of anticoagulation. Check echocardiogram Continue Eliquis 5 mg BID Will plan for DCCV after 3 weeks of uninterrupted anticoagulation.  Lifestyle modification was discussed and encouraged including alcohol reduction/cessation.   2. Snoring The importance of adequate treatment of sleep apnea was discussed today in order to improve our  ability to maintain sinus rhythm long term. Sleep study results pending.     Follow up in the AF clinic in two weeks.    [facility] [address] [place], [place] [address] [phone] 07/10/2020 8:46 AM

## 2020-07-10 ENCOUNTER — Ambulatory Visit (HOSPITAL_COMMUNITY)
Admission: RE | Admit: 2020-07-10 | Discharge: 2020-07-10 | Disposition: A | Discharge status: Home / Self Care | Payer: Medicare Other | Source: Ambulatory Visit | Attending: Physician Assistant | Admitting: Physician Assistant

## 2020-07-10 ENCOUNTER — Encounter (HOSPITAL_COMMUNITY): Payer: Self-pay | Admitting: Physician Assistant

## 2020-07-10 ENCOUNTER — Other Ambulatory Visit (HOSPITAL_COMMUNITY): Payer: Self-pay | Admitting: Physician Assistant

## 2020-07-10 ENCOUNTER — Other Ambulatory Visit: Payer: Self-pay

## 2020-07-10 VITALS — BP 138/72 | HR 55 | Ht 71.0 in | Wt 177.6 lb

## 2020-07-10 DIAGNOSIS — Z79899 Other long term (current) drug therapy: Secondary | ICD-10-CM | POA: Insufficient documentation

## 2020-07-10 DIAGNOSIS — Z7289 Other problems related to lifestyle: Secondary | ICD-10-CM | POA: Insufficient documentation

## 2020-07-10 DIAGNOSIS — R0683 Snoring: Secondary | ICD-10-CM | POA: Insufficient documentation

## 2020-07-10 DIAGNOSIS — G4731 Primary central sleep apnea: Secondary | ICD-10-CM | POA: Diagnosis not present

## 2020-07-10 DIAGNOSIS — Z87891 Personal history of nicotine dependence: Secondary | ICD-10-CM | POA: Diagnosis not present

## 2020-07-10 DIAGNOSIS — I4819 Other persistent atrial fibrillation: Secondary | ICD-10-CM | POA: Insufficient documentation

## 2020-07-10 DIAGNOSIS — E785 Hyperlipidemia, unspecified: Secondary | ICD-10-CM | POA: Diagnosis not present

## 2020-07-10 DIAGNOSIS — Z8616 Personal history of COVID-19: Secondary | ICD-10-CM | POA: Diagnosis not present

## 2020-07-10 DIAGNOSIS — Z7901 Long term (current) use of anticoagulants: Secondary | ICD-10-CM | POA: Insufficient documentation

## 2020-07-11 DIAGNOSIS — G4733 Obstructive sleep apnea (adult) (pediatric): Secondary | ICD-10-CM | POA: Diagnosis not present

## 2020-07-17 DIAGNOSIS — Z79899 Other long term (current) drug therapy: Secondary | ICD-10-CM | POA: Diagnosis not present

## 2020-07-22 ENCOUNTER — Other Ambulatory Visit: Payer: Self-pay

## 2020-07-22 ENCOUNTER — Ambulatory Visit (HOSPITAL_COMMUNITY)
Admission: RE | Admit: 2020-07-22 | Discharge: 2020-07-22 | Disposition: A | Discharge status: Home / Self Care | Payer: Medicare Other | Source: Ambulatory Visit | Attending: Physician Assistant | Admitting: Physician Assistant

## 2020-07-22 DIAGNOSIS — E785 Hyperlipidemia, unspecified: Secondary | ICD-10-CM | POA: Diagnosis not present

## 2020-07-22 DIAGNOSIS — I10 Essential (primary) hypertension: Secondary | ICD-10-CM | POA: Diagnosis not present

## 2020-07-22 DIAGNOSIS — I4819 Other persistent atrial fibrillation: Secondary | ICD-10-CM | POA: Diagnosis not present

## 2020-07-22 LAB — ECHOCARDIOGRAM COMPLETE
Area-P 1/2: 3.29 cm2
P 1/2 time: 1293 msec
S' Lateral: 3 cm

## 2020-07-22 NOTE — Progress Notes (Signed)
  Echocardiogram 2D Echocardiogram has been performed.  Moesha Sarchet G Hermann Dottavio 07/22/2020, 10:00 AM

## 2020-07-24 ENCOUNTER — Other Ambulatory Visit: Payer: Self-pay

## 2020-07-24 ENCOUNTER — Ambulatory Visit (HOSPITAL_COMMUNITY)
Admission: RE | Admit: 2020-07-24 | Discharge: 2020-07-24 | Disposition: A | Discharge status: Home / Self Care | Payer: Medicare Other | Source: Ambulatory Visit | Attending: Physician Assistant | Admitting: Physician Assistant

## 2020-07-24 ENCOUNTER — Encounter (HOSPITAL_COMMUNITY): Payer: Self-pay | Admitting: Physician Assistant

## 2020-07-24 VITALS — BP 102/70 | HR 55 | Ht 71.0 in | Wt 179.4 lb

## 2020-07-24 DIAGNOSIS — Z87891 Personal history of nicotine dependence: Secondary | ICD-10-CM | POA: Insufficient documentation

## 2020-07-24 DIAGNOSIS — I4819 Other persistent atrial fibrillation: Secondary | ICD-10-CM | POA: Diagnosis not present

## 2020-07-24 DIAGNOSIS — Z8616 Personal history of COVID-19: Secondary | ICD-10-CM | POA: Diagnosis not present

## 2020-07-24 DIAGNOSIS — G4733 Obstructive sleep apnea (adult) (pediatric): Secondary | ICD-10-CM | POA: Diagnosis not present

## 2020-07-24 DIAGNOSIS — Z8249 Family history of ischemic heart disease and other diseases of the circulatory system: Secondary | ICD-10-CM | POA: Insufficient documentation

## 2020-07-24 DIAGNOSIS — Z79899 Other long term (current) drug therapy: Secondary | ICD-10-CM | POA: Diagnosis not present

## 2020-07-24 DIAGNOSIS — Z7901 Long term (current) use of anticoagulants: Secondary | ICD-10-CM | POA: Insufficient documentation

## 2020-07-24 LAB — CBC
HCT: 47.4 % (ref 39.0–52.0)
Hemoglobin: 16.5 g/dL (ref 13.0–17.0)
MCH: 30.7 pg (ref 26.0–34.0)
MCHC: 34.8 g/dL (ref 30.0–36.0)
MCV: 88.1 fL (ref 80.0–100.0)
Platelets: 179 10*3/uL (ref 150–400)
RBC: 5.38 MIL/uL (ref 4.22–5.81)
RDW: 11.9 % (ref 11.5–15.5)
WBC: 5.1 10*3/uL (ref 4.0–10.5)
nRBC: 0 % (ref 0.0–0.2)

## 2020-07-24 LAB — COMPREHENSIVE METABOLIC PANEL
ALT: 40 U/L (ref 0–44)
AST: 29 U/L (ref 15–41)
Albumin: 3.9 g/dL (ref 3.5–5.0)
Alkaline Phosphatase: 61 U/L (ref 38–126)
Anion gap: 10 (ref 5–15)
BUN: 13 mg/dL (ref 8–23)
CO2: 27 mmol/L (ref 22–32)
Calcium: 9.4 mg/dL (ref 8.9–10.3)
Chloride: 101 mmol/L (ref 98–111)
Creatinine, Ser: 1.07 mg/dL (ref 0.61–1.24)
GFR, Estimated: >60 mL/min (ref >60)
Glucose, Bld: 91 mg/dL (ref 70–99)
Potassium: 4.1 mmol/L (ref 3.5–5.1)
Sodium: 138 mmol/L (ref 135–145)
Total Bilirubin: 2.3 mg/dL — ABNORMAL HIGH (ref 0.3–1.2)
Total Protein: 6.6 g/dL (ref 6.5–8.1)

## 2020-07-24 NOTE — Addendum Note (Signed)
Encounter addended by: Juluis Mire, RN on: 07/24/2020 9:21 AM  Actions taken: Clinical Note Signed, Pharmacy for encounter modified, Order list changed

## 2020-07-24 NOTE — H&P (View-Only) (Signed)
  Primary Care Physician: Stoneking, Hal, MD Primary Cardiologist: none Primary Electrophysiologist: none Referring Physician: Dr Stoneking   Jonathan Strickland is a 69 y.o. male with a history of HLD and atrial fibrillation who presents for follow up in the [facility]. The patient was initially diagnosed with atrial fibrillation 07/01/20 after presenting to his PCP with an alert on his smart watch for afib. ECG confirmed rate controlled afib. Of note, he was diagnosed with COVID 05/2019. Patient was started on Eliquis for a CHADS2VASC score of 1. He does admit that he had more alcohol than usual about 4 days prior to his afib diagnosis. He denies any awareness of his afib. He has completed a sleep study and the results are pending.   On follow up today, patient reports he has done well since his last visit. He remains fairly asymptomatic with his afib. He denies any bleeding issues with anticoagulation. Echo showed EF 60-65%, mild LVH.  Today, he denies symptoms of palpitations, chest pain, shortness of breath, orthopnea, PND, lower extremity edema, dizziness, presyncope, syncope, bleeding, or neurologic sequela. The patient is tolerating medications without difficulties and is otherwise without complaint today.    Atrial Fibrillation Risk Factors:  he does have symptoms or diagnosis of sleep apnea. Sleep study results pending. he does not have a history of rheumatic fever. he does have a history of alcohol use. The patient does have a history of early familial atrial fibrillation or other arrhythmias. Father got PPM age 50s  he has a BMI of Body mass index is 25.02 kg/m.. Filed Weights   07/24/20 0839  Weight: 81.4 kg    Family History  Problem Relation Age of Onset  . CVA Mother   . Heart attack Father   . Colon cancer Sister 30       colo-recto CA died at 33 y.o.  . Heart attack Brother 33  . Colon cancer Paternal Aunt 30  . Colon cancer Cousin   .  Leukemia Sister 4     Atrial Fibrillation Management history:  Previous antiarrhythmic drugs: none Previous cardioversions: none Previous ablations: none CHADS2VASC score: 1 Anticoagulation history: Eliquis   Past Medical History:  Diagnosis Date  . Asthma    as a child  . ENT complaint   . Family history of early CAD   . Family hx of colon cancer   . H/O mastectomy   . Hearing loss   . Hypercholesterolemia   . Hyperlipidemia   . Sebaceous cyst of left axilla    Past Surgical History:  Procedure Laterality Date  . COLONOSCOPY    . CYST REMOVAL HAND    . INGUINAL HERNIA REPAIR  1987  . TONSILLECTOMY    . VASECTOMY      Current Outpatient Medications  Medication Sig Dispense Refill  . apixaban (ELIQUIS) 5 MG TABS tablet Take 5 mg by mouth 2 (two) times daily.    . Cholecalciferol (VITAMIN D PO) Take 1 tablet by mouth daily.    . co-enzyme Q-10 30 MG capsule Take 100 mg by mouth daily.    . rosuvastatin (CRESTOR) 10 MG tablet Take 10 mg by mouth daily.     No current facility-administered medications for this encounter.    Allergies  Allergen Reactions  . Lipitor [Atorvastatin]     Elevated liver enzymes    Social History   Socioeconomic History  . Marital status: Married    Spouse name: Not on file  . Number   of children: Not on file  . Years of education: Not on file  . Highest education level: Not on file  Occupational History  . Not on file  Tobacco Use  . Smoking status: Never Smoker  . Smokeless tobacco: Never Used  Substance and Sexual Activity  . Alcohol use: Yes    Alcohol/week: 8.0 standard drinks    Types: 7 Glasses of wine, 1 Standard drinks or equivalent per week    Comment: glass wine with dinner  . Drug use: No  . Sexual activity: Not on file  Other Topics Concern  . Not on file  Social History Narrative   History of smoking cigarettes: former smoker, quit in year 1974, pack-year Hs: 3. No smoking. No alcohol. No recreational drug  use. 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   Social Determinants of Health   Financial Resource Strain: Not on file  Food Insecurity: Not on file  Transportation Needs: Not on file  Physical Activity: Not on file  Stress: Not on file  Social Connections: Not on file  Intimate Partner Violence: Not on file     ROS- All systems are reviewed and negative except as per the HPI above.  Physical Exam: Vitals:   07/24/20 0839  BP: 102/70  Pulse: (!) 55  Weight: 81.4 kg  Height: 5\' 11"  (1.803 m)    GEN- The patient is well appearing, alert and oriented x 3 today.   HEENT-head normocephalic, atraumatic, sclera clear, conjunctiva pink, hearing intact, trachea midline. Lungs- Clear to ausculation bilaterally, normal work of breathing Heart- irregular rate and rhythm, no murmurs, rubs or gallops  GI- soft, NT, ND, + BS Extremities- no clubbing, cyanosis, or edema MS- no significant deformity or atrophy Skin- no rash or lesion Psych- euthymic mood, full affect Neuro- strength and sensation are intact   Wt Readings from Last 3 Encounters:  07/24/20 81.4 kg  07/10/20 80.6 kg  09/03/13 83 kg    EKG today demonstrates  Afib  Vent. rate 55 BPM QRS duration 96 ms QT/QTc 424/405 ms  Epic records are reviewed at length today  CHA2DS2-VASc Score = 1  The patient's score is based upon: CHF History: No HTN History: No Diabetes History: No Stroke History: No Vascular Disease History: No Age Score: 1 Gender Score: 0      ASSESSMENT AND PLAN: 1. Persistent Atrial Fibrillation (ICD10:  I48.19) The patient's CHA2DS2-VASc score is 1, indicating a 0.6% annual risk of stroke.   Patient remains in rate controlled afib. Will plan for DCCV now that he has been on 3 weeks of anticoagulation. Continue Eliquis 5 mg BID Patient has not had any alcohol to drink.  Check cmet/CBC today.  2. OSA Followed by Dr Maxwell Caul    Follow up in the AF clinic post DCCV.     [facility] [address] [place], [place] [address] [phone] 07/24/2020 8:45 AM

## 2020-07-24 NOTE — Patient Instructions (Addendum)
Cardioversion scheduled for Thursday, February 3rd  - Arrive at the [facility] and go to admitting at 730AM  - Do not eat or drink anything after midnight the night prior to your procedure.  - Take all your morning medication (except diabetic medications) with a sip of water prior to arrival.  - You will not be able to drive home after your procedure.  - Do NOT miss any doses of your blood thinner - if you should miss a dose please notify our office immediately.  - If you feel as if you go back into normal rhythm prior to scheduled cardioversion, please notify our office immediately. If your procedure is canceled in the cardioversion suite you will be charged a cancellation fee.

## 2020-07-24 NOTE — Addendum Note (Signed)
Encounter addended by: Oliver Barre, PA on: 07/24/2020 9:20 AM  Actions taken: Clinical Note Signed

## 2020-07-24 NOTE — Progress Notes (Addendum)
Primary Care Physician: Lajean Manes, MD Primary Cardiologist: none Primary Electrophysiologist: none Referring Physician: Dr Betsy Coder is a 70 y.o. male with a history of HLD and atrial fibrillation who presents for follow up in the [facility]. The patient was initially diagnosed with atrial fibrillation 07/01/20 after presenting to his PCP with an alert on his smart watch for afib. ECG confirmed rate controlled afib. Of note, he was diagnosed with COVID 05/2019. Patient was started on Eliquis for a CHADS2VASC score of 1. He does admit that he had more alcohol than usual about 4 days prior to his afib diagnosis. He denies any awareness of his afib. He has completed a sleep study and the results are pending.   On follow up today, patient reports he has done well since his last visit. He remains fairly asymptomatic with his afib. He denies any bleeding issues with anticoagulation. Echo showed EF 60-65%, mild LVH.  Today, he denies symptoms of palpitations, chest pain, shortness of breath, orthopnea, PND, lower extremity edema, dizziness, presyncope, syncope, bleeding, or neurologic sequela. The patient is tolerating medications without difficulties and is otherwise without complaint today.    Atrial Fibrillation Risk Factors:  he does have symptoms or diagnosis of sleep apnea. Sleep study results pending. he does not have a history of rheumatic fever. he does have a history of alcohol use. The patient does have a history of early familial atrial fibrillation or other arrhythmias. Father got PPM age 38s  he has a BMI of Body mass index is 25.02 kg/m.Marland Kitchen Filed Weights   07/24/20 0839  Weight: 81.4 kg    Family History  Problem Relation Age of Onset  . CVA Mother   . Heart attack Father   . Colon cancer Sister 33       colo-recto CA died at 43 y.o.  . Heart attack Brother 60  . Colon cancer Paternal Aunt 70  . Colon cancer Cousin   .  Leukemia Sister 4     Atrial Fibrillation Management history:  Previous antiarrhythmic drugs: none Previous cardioversions: none Previous ablations: none CHADS2VASC score: 1 Anticoagulation history: Eliquis   Past Medical History:  Diagnosis Date  . Asthma    as a child  . ENT complaint   . Family history of early CAD   . Family hx of colon cancer   . H/O mastectomy   . Hearing loss   . Hypercholesterolemia   . Hyperlipidemia   . Sebaceous cyst of left axilla    Past Surgical History:  Procedure Laterality Date  . COLONOSCOPY    . CYST REMOVAL HAND    . INGUINAL HERNIA REPAIR  1987  . TONSILLECTOMY    . VASECTOMY      Current Outpatient Medications  Medication Sig Dispense Refill  . apixaban (ELIQUIS) 5 MG TABS tablet Take 5 mg by mouth 2 (two) times daily.    . Cholecalciferol (VITAMIN D PO) Take 1 tablet by mouth daily.    Marland Kitchen co-enzyme Q-10 30 MG capsule Take 100 mg by mouth daily.    . rosuvastatin (CRESTOR) 10 MG tablet Take 10 mg by mouth daily.     No current facility-administered medications for this encounter.    Allergies  Allergen Reactions  . Lipitor [Atorvastatin]     Elevated liver enzymes    Social History   Socioeconomic History  . Marital status: Married    Spouse name: Not on file  . Number  of children: Not on file  . Years of education: Not on file  . Highest education level: Not on file  Occupational History  . Not on file  Tobacco Use  . Smoking status: Never Smoker  . Smokeless tobacco: Never Used  Substance and Sexual Activity  . Alcohol use: Yes    Alcohol/week: 8.0 standard drinks    Types: 7 Glasses of wine, 1 Standard drinks or equivalent per week    Comment: glass wine with dinner  . Drug use: No  . Sexual activity: Not on file  Other Topics Concern  . Not on file  Social History Narrative   History of smoking cigarettes: former smoker, quit in year 1974, pack-year Hs: 3. No smoking. No alcohol. No recreational drug  use. 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   Social Determinants of Health   Financial Resource Strain: Not on file  Food Insecurity: Not on file  Transportation Needs: Not on file  Physical Activity: Not on file  Stress: Not on file  Social Connections: Not on file  Intimate Partner Violence: Not on file     ROS- All systems are reviewed and negative except as per the HPI above.  Physical Exam: Vitals:   07/24/20 0839  BP: 102/70  Pulse: (!) 55  Weight: 81.4 kg  Height: 5\' 11"  (1.803 m)    GEN- The patient is well appearing, alert and oriented x 3 today.   HEENT-head normocephalic, atraumatic, sclera clear, conjunctiva pink, hearing intact, trachea midline. Lungs- Clear to ausculation bilaterally, normal work of breathing Heart- irregular rate and rhythm, no murmurs, rubs or gallops  GI- soft, NT, ND, + BS Extremities- no clubbing, cyanosis, or edema MS- no significant deformity or atrophy Skin- no rash or lesion Psych- euthymic mood, full affect Neuro- strength and sensation are intact   Wt Readings from Last 3 Encounters:  07/24/20 81.4 kg  07/10/20 80.6 kg  09/03/13 83 kg    EKG today demonstrates  Afib  Vent. rate 55 BPM QRS duration 96 ms QT/QTc 424/405 ms  Epic records are reviewed at length today  CHA2DS2-VASc Score = 1  The patient's score is based upon: CHF History: No HTN History: No Diabetes History: No Stroke History: No Vascular Disease History: No Age Score: 1 Gender Score: 0      ASSESSMENT AND PLAN: 1. Persistent Atrial Fibrillation (ICD10:  I48.19) The patient's CHA2DS2-VASc score is 1, indicating a 0.6% annual risk of stroke.   Patient remains in rate controlled afib. Will plan for DCCV now that he has been on 3 weeks of anticoagulation. Continue Eliquis 5 mg BID Patient has not had any alcohol to drink.  Check cmet/CBC today.  2. OSA Followed by Dr Maxwell Caul    Follow up in the AF clinic post DCCV.     [facility] [address] [place], [place] [address] [phone] 07/24/2020 8:45 AM

## 2020-07-25 ENCOUNTER — Other Ambulatory Visit (HOSPITAL_COMMUNITY): Payer: Medicare Other

## 2020-07-29 ENCOUNTER — Other Ambulatory Visit (HOSPITAL_COMMUNITY)
Admission: RE | Admit: 2020-07-29 | Discharge: 2020-07-29 | Disposition: A | Discharge status: Home / Self Care | Payer: Medicare Other | Source: Ambulatory Visit | Attending: Internal Medicine | Admitting: Internal Medicine

## 2020-07-29 DIAGNOSIS — Z01812 Encounter for preprocedural laboratory examination: Secondary | ICD-10-CM | POA: Insufficient documentation

## 2020-07-29 DIAGNOSIS — Z20822 Contact with and (suspected) exposure to covid-19: Secondary | ICD-10-CM | POA: Diagnosis not present

## 2020-07-29 LAB — SARS CORONAVIRUS 2 (TAT 6-24 HRS): SARS Coronavirus 2: NEGATIVE

## 2020-07-31 ENCOUNTER — Other Ambulatory Visit: Payer: Self-pay

## 2020-07-31 ENCOUNTER — Ambulatory Visit (HOSPITAL_COMMUNITY): Payer: Medicare Other | Admitting: Certified Registered Nurse Anesthetist

## 2020-07-31 ENCOUNTER — Ambulatory Visit (HOSPITAL_COMMUNITY)
Admission: RE | Admit: 2020-07-31 | Discharge: 2020-07-31 | Disposition: A | Discharge status: Home / Self Care | Payer: Medicare Other | Attending: Internal Medicine | Admitting: Internal Medicine

## 2020-07-31 ENCOUNTER — Encounter (HOSPITAL_COMMUNITY): Payer: Self-pay | Admitting: Internal Medicine

## 2020-07-31 ENCOUNTER — Encounter (HOSPITAL_COMMUNITY)
Admission: RE | Disposition: A | Discharge status: Home / Self Care | Payer: Self-pay | Source: Home / Self Care | Attending: Internal Medicine

## 2020-07-31 DIAGNOSIS — Z79899 Other long term (current) drug therapy: Secondary | ICD-10-CM | POA: Diagnosis not present

## 2020-07-31 DIAGNOSIS — I4819 Other persistent atrial fibrillation: Secondary | ICD-10-CM | POA: Insufficient documentation

## 2020-07-31 DIAGNOSIS — Z8709 Personal history of other diseases of the respiratory system: Secondary | ICD-10-CM | POA: Diagnosis not present

## 2020-07-31 DIAGNOSIS — G4733 Obstructive sleep apnea (adult) (pediatric): Secondary | ICD-10-CM | POA: Diagnosis not present

## 2020-07-31 DIAGNOSIS — E7800 Pure hypercholesterolemia, unspecified: Secondary | ICD-10-CM | POA: Diagnosis not present

## 2020-07-31 DIAGNOSIS — Z7901 Long term (current) use of anticoagulants: Secondary | ICD-10-CM | POA: Insufficient documentation

## 2020-07-31 DIAGNOSIS — Z800 Family history of malignant neoplasm of digestive organs: Secondary | ICD-10-CM | POA: Diagnosis not present

## 2020-07-31 HISTORY — PX: CARDIOVERSION: SHX1299

## 2020-07-31 SURGERY — CARDIOVERSION
Anesthesia: General

## 2020-07-31 MED ORDER — SODIUM CHLORIDE 0.9 % IV SOLN: Freq: Once | INTRAVENOUS | Status: AC

## 2020-07-31 MED ORDER — LIDOCAINE 2% (20 MG/ML) 5 ML SYRINGE
INTRAMUSCULAR | Status: DC | PRN
  Administered 2020-07-31: 100 mg via INTRAVENOUS

## 2020-07-31 MED ORDER — SODIUM CHLORIDE 0.9 % IV SOLN: INTRAVENOUS | Status: DC | PRN

## 2020-07-31 MED ORDER — PROPOFOL 10 MG/ML IV BOLUS
INTRAVENOUS | Status: DC | PRN
  Administered 2020-07-31: 60 mg via INTRAVENOUS

## 2020-07-31 NOTE — Interval H&P Note (Signed)
History and Physical Interval Note:  07/31/2020 7:57 AM  Jonathan Strickland  has presented today for surgery, with the diagnosis of AFIB.  The various methods of treatment have been discussed with the patient and family. After consideration of risks, benefits and other options for treatment, the patient has consented to  Procedure(s): CARDIOVERSION (N/A) as a surgical intervention.  The patient's history has been reviewed, patient examined, no change in status, stable for surgery.  I have reviewed the patient's chart and labs.  Questions were answered to the patient's satisfaction.     [name]

## 2020-07-31 NOTE — Anesthesia Postprocedure Evaluation (Signed)
Anesthesia Post Note  Patient: Jonathan Strickland  Procedure(s) Performed: CARDIOVERSION (N/A )     Patient location during evaluation: Endoscopy Anesthesia Type: General Level of consciousness: awake and alert Pain management: pain level controlled Vital Signs Assessment: post-procedure vital signs reviewed and stable Respiratory status: spontaneous breathing, nonlabored ventilation and respiratory function stable Cardiovascular status: blood pressure returned to baseline and stable Postop Assessment: no apparent nausea or vomiting Anesthetic complications: no   No complications documented.  Last Vitals:  Vitals:   07/31/20 0831 07/31/20 0850  BP: 140/86 128/81  Pulse:  (!) 59  Resp:  (!) 8  Temp:    SpO2:  98%    Last Pain:  Vitals:   07/31/20 0850  TempSrc:   PainSc: 0-No pain                 Lidia Collum

## 2020-07-31 NOTE — Anesthesia Procedure Notes (Signed)
Procedure Name: General with mask airway Date/Time: 07/31/2020 8:26 AM Performed by: Colin Benton, CRNA Pre-anesthesia Checklist: Patient identified, Emergency Drugs available, Suction available and Patient being monitored Patient Re-evaluated:Patient Re-evaluated prior to induction Oxygen Delivery Method: Ambu bag Preoxygenation: Pre-oxygenation with 100% oxygen Induction Type: IV induction Placement Confirmation: positive ETCO2 Dental Injury: Teeth and Oropharynx as per pre-operative assessment

## 2020-07-31 NOTE — Discharge Instructions (Signed)
Electrical Cardioversion Electrical cardioversion is the delivery of a jolt of electricity to restore a normal rhythm to the heart. A rhythm that is too fast or is not regular keeps the heart from pumping well. In this procedure, sticky patches or metal paddles are placed on the chest to deliver electricity to the heart from a device. This procedure may be done in an emergency if:  There is low or no blood pressure as a result of the heart rhythm.  Normal rhythm must be restored as fast as possible to protect the brain and heart from further damage.  It may save a life. This may also be a scheduled procedure for irregular or fast heart rhythms that are not immediately life-threatening. Tell a health care provider about:  Any allergies you have.  All medicines you are taking, including vitamins, herbs, eye drops, creams, and over-the-counter medicines.  Any problems you or family members have had with anesthetic medicines.  Any blood disorders you have.  Any surgeries you have had.  Any medical conditions you have.  Whether you are pregnant or may be pregnant. What are the risks? Generally, this is a safe procedure. However, problems may occur, including:  Allergic reactions to medicines.  A blood clot that breaks free and travels to other parts of your body.  The possible return of an abnormal heart rhythm within hours or days after the procedure.  Your heart stopping (cardiac arrest). This is rare. What happens before the procedure? Medicines  Your health care provider may have you start taking: ? Blood-thinning medicines (anticoagulants) so your blood does not clot as easily. ? Medicines to help stabilize your heart rate and rhythm.  Ask your health care provider about: ? Changing or stopping your regular medicines. This is especially important if you are taking diabetes medicines or blood thinners. ? Taking medicines such as aspirin and ibuprofen. These medicines can  thin your blood. Do not take these medicines unless your health care provider tells you to take them. ? Taking over-the-counter medicines, vitamins, herbs, and supplements. General instructions  Follow instructions from your health care provider about eating or drinking restrictions.  Plan to have someone take you home from the hospital or clinic.  If you will be going home right after the procedure, plan to have someone with you for 24 hours.  Ask your health care provider what steps will be taken to help prevent infection. These may include washing your skin with a germ-killing soap. What happens during the procedure?  An IV will be inserted into one of your veins.  Sticky patches (electrodes) or metal paddles may be placed on your chest.  You will be given a medicine to help you relax (sedative).  An electrical shock will be delivered. The procedure may vary among health care providers and hospitals.   What can I expect after the procedure?  Your blood pressure, heart rate, breathing rate, and blood oxygen level will be monitored until you leave the hospital or clinic.  Your heart rhythm will be watched to make sure it does not change.  You may have some redness on the skin where the shocks were given. Follow these instructions at home:  Do not drive for 24 hours if you were given a sedative during your procedure.  Take over-the-counter and prescription medicines only as told by your health care provider.  Ask your health care provider how to check your pulse. Check it often.  Rest for 48 hours after the procedure   or as told by your health care provider.  Avoid or limit your caffeine use as told by your health care provider.  Keep all follow-up visits as told by your health care provider. This is important. Contact a health care provider if:  You feel like your heart is beating too quickly or your pulse is not regular.  You have a serious muscle cramp that does not go  away. Get help right away if:  You have discomfort in your chest.  You are dizzy or you feel faint.  You have trouble breathing or you are short of breath.  Your speech is slurred.  You have trouble moving an arm or leg on one side of your body.  Your fingers or toes turn cold or blue. Summary  Electrical cardioversion is the delivery of a jolt of electricity to restore a normal rhythm to the heart.  This procedure may be done right away in an emergency or may be a scheduled procedure if the condition is not an emergency.  Generally, this is a safe procedure.  After the procedure, check your pulse often as told by your health care provider. This information is not intended to replace advice given to you by your health care provider. Make sure you discuss any questions you have with your health care provider. Document Revised: 01/15/2019 Document Reviewed: 01/15/2019 Elsevier Patient Education  2021 Elsevier Inc.  

## 2020-07-31 NOTE — Transfer of Care (Signed)
Immediate Anesthesia Transfer of Care Note  Patient: Jonathan Strickland  Procedure(s) Performed: CARDIOVERSION (N/A )  Patient Location: Endoscopy Unit  Anesthesia Type:General  Level of Consciousness: drowsy  Airway & Oxygen Therapy: Patient Spontanous Breathing  Post-op Assessment: Report given to RN and Post -op Vital signs reviewed and stable  Post vital signs: Reviewed and stable  Last Vitals:  Vitals Value Taken Time  BP 138/81 07/31/20 0829  Temp    Pulse 61 07/31/20 0829  Resp 16 07/31/20 0829  SpO2 100 % 07/31/20 0829    Last Pain:  Vitals:   07/31/20 0747  TempSrc: Oral  PainSc: 0-No pain         Complications: No complications documented.

## 2020-07-31 NOTE — CV Procedure (Signed)
Procedure: Electrical Cardioversion Indications:  Atrial Fibrillation  Procedure Details:  Consent: Risks of procedure as well as the alternatives and risks of each were explained to the (patient/caregiver).  Consent for procedure obtained.  Time Out: Verified patient identification, verified procedure, site/side was marked, verified correct patient position, special equipment/implants available, medications/allergies/relevent history reviewed, required imaging and test results available. PERFORMED.  Patient placed on cardiac monitor, pulse oximetry, supplemental oxygen as necessary.  Sedation given: propofol per anesthesia Pacer pads placed anterior and posterior chest.  Cardioverted 1 time(s).  Cardioversion with synchronized biphasic 120J shock.  Evaluation: Findings: Post procedure EKG shows: NSR Complications: None Patient did tolerate procedure well.  Time Spent Directly with the Patient:  30 minutes   [place] 07/31/2020, 8:30 AM

## 2020-07-31 NOTE — Anesthesia Preprocedure Evaluation (Signed)
Anesthesia Evaluation  Patient identified by MRN, date of birth, ID band Patient awake    Reviewed: Allergy & Precautions, NPO status , Patient's Chart, lab work & pertinent test results  History of Anesthesia Complications Negative for: history of anesthetic complications  Airway Mallampati: II  TM Distance: >3 FB Neck ROM: Full    Dental   Pulmonary neg pulmonary ROS,    Pulmonary exam normal        Cardiovascular + dysrhythmias Atrial Fibrillation  Rhythm:Irregular Rate:Bradycardia     Neuro/Psych negative neurological ROS  negative psych ROS   GI/Hepatic negative GI ROS, Neg liver ROS,   Endo/Other  negative endocrine ROS  Renal/GU negative Renal ROS  negative genitourinary   Musculoskeletal negative musculoskeletal ROS (+)   Abdominal   Peds  Hematology Eliquis   Anesthesia Other Findings   Reproductive/Obstetrics                             Anesthesia Physical Anesthesia Plan  ASA: III  Anesthesia Plan: General   Post-op Pain Management:    Induction: Intravenous  PONV Risk Score and Plan: 2 and TIVA and Treatment may vary due to age or medical condition  Airway Management Planned: Mask  Additional Equipment: None  Intra-op Plan:   Post-operative Plan:   Informed Consent: I have reviewed the patients History and Physical, chart, labs and discussed the procedure including the risks, benefits and alternatives for the proposed anesthesia with the patient or authorized representative who has indicated his/her understanding and acceptance.       Plan Discussed with:   Anesthesia Plan Comments:         Anesthesia Quick Evaluation

## 2020-08-05 NOTE — Progress Notes (Signed)
Primary Care Physician: Lajean Manes, MD Primary Cardiologist: none Primary Electrophysiologist: none Referring Physician: Dr Betsy Coder is a 70 y.o. male with a history of HLD and atrial fibrillation who presents for follow up in the [facility]. The patient was initially diagnosed with atrial fibrillation 07/01/20 after presenting to his PCP with an alert on his smart watch for afib. ECG confirmed rate controlled afib. Of note, he was diagnosed with COVID 05/2019. Patient was started on Eliquis for a CHADS2VASC score of 1. He does admit that he had more alcohol than usual about 4 days prior to his afib diagnosis. He denies any awareness of his afib.   On follow up today, patient is s/p DCCV on 07/31/20. Unfortunately, he is back in afib today. He reports that he noticed no difference when he was in SR. He continues to feel well and remains very active. He denies any bleeding issues on anticoagulation.   Today, he denies symptoms of palpitations, chest pain, shortness of breath, orthopnea, PND, lower extremity edema, dizziness, presyncope, syncope, bleeding, or neurologic sequela. The patient is tolerating medications without difficulties and is otherwise without complaint today.    Atrial Fibrillation Risk Factors:  he does have symptoms or diagnosis of sleep apnea. he does not have a history of rheumatic fever. he does have a history of alcohol use. The patient does have a history of early familial atrial fibrillation or other arrhythmias. Father got PPM age 35s  he has a BMI of Body mass index is 25.3 kg/m.Marland Kitchen Filed Weights   08/06/20 0841  Weight: 82.3 kg    Family History  Problem Relation Age of Onset  . CVA Mother   . Heart attack Father   . Colon cancer Sister 2       colo-recto CA died at 88 y.o.  . Heart attack Brother 34  . Colon cancer Paternal Aunt 18  . Colon cancer Cousin   . Leukemia Sister 4     Atrial Fibrillation  Management history:  Previous antiarrhythmic drugs: none Previous cardioversions: 07/31/20 Previous ablations: none CHADS2VASC score: 1 Anticoagulation history: Eliquis   Past Medical History:  Diagnosis Date  . Asthma    as a child  . ENT complaint   . Family history of early CAD   . Family hx of colon cancer   . H/O mastectomy   . Hearing loss   . Hypercholesterolemia   . Hyperlipidemia   . Sebaceous cyst of left axilla    Past Surgical History:  Procedure Laterality Date  . CARDIOVERSION N/A 07/31/2020   Procedure: CARDIOVERSION;  Surgeon: [name], MD;  Location: [facility] ENDOSCOPY;  Service: Cardiovascular;  Laterality: N/A;  . COLONOSCOPY    . CYST REMOVAL HAND    . INGUINAL HERNIA REPAIR  1987  . TONSILLECTOMY    . VASECTOMY      Current Outpatient Medications  Medication Sig Dispense Refill  . apixaban (ELIQUIS) 5 MG TABS tablet Take 5 mg by mouth 2 (two) times daily.    . Cholecalciferol (VITAMIN D PO) Take 5,000 Units by mouth daily.    . Coenzyme Q10 100 MG capsule Take 100 mg by mouth daily.    . rosuvastatin (CRESTOR) 10 MG tablet Take 10 mg by mouth daily.     No current facility-administered medications for this encounter.    Allergies  Allergen Reactions  . Lipitor [Atorvastatin]     Elevated liver enzymes  Social History   Socioeconomic History  . Marital status: Married    Spouse name: Not on file  . Number of children: Not on file  . Years of education: Not on file  . Highest education level: Not on file  Occupational History  . Not on file  Tobacco Use  . Smoking status: Never Smoker  . Smokeless tobacco: Never Used  Substance and Sexual Activity  . Alcohol use: Yes    Alcohol/week: 8.0 standard drinks    Types: 7 Glasses of wine, 1 Standard drinks or equivalent per week    Comment: glass wine with dinner  . Drug use: No  . Sexual activity: Not on file  Other Topics Concern  . Not on file  Social History Narrative   History  of smoking cigarettes: former smoker, quit in year 1974, pack-year Hs: 3. No smoking. No alcohol. No recreational drug use. 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   Social Determinants of Health   Financial Resource Strain: Not on file  Food Insecurity: Not on file  Transportation Needs: Not on file  Physical Activity: Not on file  Stress: Not on file  Social Connections: Not on file  Intimate Partner Violence: Not on file     ROS- All systems are reviewed and negative except as per the HPI above.  Physical Exam: Vitals:   08/06/20 0841  BP: 128/90  Pulse: 60  Weight: 82.3 kg  Height: 5\' 11"  (1.803 m)    GEN- The patient is well appearing, alert and oriented x 3 today.   HEENT-head normocephalic, atraumatic, sclera clear, conjunctiva pink, hearing intact, trachea midline. Lungs- Clear to ausculation bilaterally, normal work of breathing Heart- irregular rate and rhythm, no murmurs, rubs or gallops  GI- soft, NT, ND, + BS Extremities- no clubbing, cyanosis, or edema MS- no significant deformity or atrophy Skin- no rash or lesion Psych- euthymic mood, full affect Neuro- strength and sensation are intact   Wt Readings from Last 3 Encounters:  08/06/20 82.3 kg  07/24/20 81.4 kg  07/10/20 80.6 kg    EKG today demonstrates  Coarse afib Vent. rate 60 BPM QRS duration 88 ms QT/QTc 390/390 ms  Echo 07/22/20 demonstrated 1. Left ventricular ejection fraction, by estimation, is 60 to 65%. The  left ventricle has normal function. The left ventricle has no regional  wall motion abnormalities. There is mild left ventricular hypertrophy.  Left ventricular diastolic parameters  are indeterminate.  2. Right ventricular systolic function is normal. The right ventricular  size is normal. Tricuspid regurgitation signal is inadequate for assessing  PA pressure.  3. Left atrial size was mildly dilated.  4. The mitral valve is normal in structure. Trivial  mitral valve  regurgitation.  5. The aortic valve is tricuspid. Aortic valve regurgitation is trivial.  No aortic stenosis is present.  6. The inferior vena cava is normal in size with greater than 50%  respiratory variability, suggesting right atrial pressure of 3 mmHg.   Epic records are reviewed at length today  CHA2DS2-VASc Score = 1  The patient's score is based upon: CHF History: No HTN History: No Diabetes History: No Stroke History: No Vascular Disease History: No Age Score: 1 Gender Score: 0      ASSESSMENT AND PLAN: 1. Persistent Atrial Fibrillation (ICD10:  I48.19) The patient's CHA2DS2-VASc score is 1, indicating a 0.6% annual risk of stroke.   S/p DCCV on 07/31/20 with early return of afib We discussed therapeutic options today including AAD, ablation,  and rate control. Given his preserved EF and paucity of symptoms, patient would like to pursue a rate control strategy. We also discussed the guidelines regarding anticoagulation. With a CV score of 1, patient has elected to remain on long term anticoagulation given he is going to stay in afib.  Continue Eliquis 5 mg BID  2. OSA Followed by Dr Maxwell Caul   Follow up in the AF clinic in 3-4 months.    [facility] [address] [place], [place] [address] [phone] 08/06/2020 8:49 AM

## 2020-08-06 ENCOUNTER — Ambulatory Visit (HOSPITAL_COMMUNITY)
Admission: RE | Admit: 2020-08-06 | Discharge: 2020-08-06 | Disposition: A | Discharge status: Home / Self Care | Payer: Medicare Other | Source: Ambulatory Visit | Attending: Physician Assistant | Admitting: Physician Assistant

## 2020-08-06 ENCOUNTER — Encounter (HOSPITAL_COMMUNITY): Payer: Self-pay | Admitting: Physician Assistant

## 2020-08-06 ENCOUNTER — Other Ambulatory Visit: Payer: Self-pay

## 2020-08-06 VITALS — BP 128/90 | HR 60 | Ht 71.0 in | Wt 181.4 lb

## 2020-08-06 DIAGNOSIS — Z7901 Long term (current) use of anticoagulants: Secondary | ICD-10-CM | POA: Insufficient documentation

## 2020-08-06 DIAGNOSIS — G4733 Obstructive sleep apnea (adult) (pediatric): Secondary | ICD-10-CM | POA: Insufficient documentation

## 2020-08-06 DIAGNOSIS — Z87891 Personal history of nicotine dependence: Secondary | ICD-10-CM | POA: Diagnosis not present

## 2020-08-06 DIAGNOSIS — Z888 Allergy status to other drugs, medicaments and biological substances status: Secondary | ICD-10-CM | POA: Diagnosis not present

## 2020-08-06 DIAGNOSIS — I4819 Other persistent atrial fibrillation: Secondary | ICD-10-CM

## 2020-08-06 DIAGNOSIS — Z8249 Family history of ischemic heart disease and other diseases of the circulatory system: Secondary | ICD-10-CM | POA: Diagnosis not present

## 2020-08-06 DIAGNOSIS — E785 Hyperlipidemia, unspecified: Secondary | ICD-10-CM | POA: Insufficient documentation

## 2020-08-06 DIAGNOSIS — Z8616 Personal history of COVID-19: Secondary | ICD-10-CM | POA: Insufficient documentation

## 2020-08-06 DIAGNOSIS — Z79899 Other long term (current) drug therapy: Secondary | ICD-10-CM | POA: Diagnosis not present

## 2020-08-06 MED ORDER — APIXABAN 5 MG PO TABS: 5.0000 mg | ORAL_TABLET | Freq: Two times a day (BID) | ORAL | 6 refills | Status: AC

## 2020-08-11 ENCOUNTER — Other Ambulatory Visit (HOSPITAL_BASED_OUTPATIENT_CLINIC_OR_DEPARTMENT_OTHER): Payer: Self-pay

## 2020-08-11 DIAGNOSIS — G4733 Obstructive sleep apnea (adult) (pediatric): Secondary | ICD-10-CM

## 2020-08-11 DIAGNOSIS — G4731 Primary central sleep apnea: Secondary | ICD-10-CM

## 2020-08-21 DIAGNOSIS — M542 Cervicalgia: Secondary | ICD-10-CM | POA: Diagnosis not present

## 2020-08-26 DIAGNOSIS — M542 Cervicalgia: Secondary | ICD-10-CM | POA: Diagnosis not present

## 2020-08-28 DIAGNOSIS — M542 Cervicalgia: Secondary | ICD-10-CM | POA: Diagnosis not present

## 2020-09-02 DIAGNOSIS — M542 Cervicalgia: Secondary | ICD-10-CM | POA: Diagnosis not present

## 2020-09-04 DIAGNOSIS — M542 Cervicalgia: Secondary | ICD-10-CM | POA: Diagnosis not present

## 2020-09-09 DIAGNOSIS — M542 Cervicalgia: Secondary | ICD-10-CM | POA: Diagnosis not present

## 2020-09-11 DIAGNOSIS — M542 Cervicalgia: Secondary | ICD-10-CM | POA: Diagnosis not present

## 2020-09-16 DIAGNOSIS — M542 Cervicalgia: Secondary | ICD-10-CM | POA: Diagnosis not present

## 2020-09-18 DIAGNOSIS — M542 Cervicalgia: Secondary | ICD-10-CM | POA: Diagnosis not present

## 2020-09-25 ENCOUNTER — Other Ambulatory Visit: Payer: Self-pay

## 2020-09-25 ENCOUNTER — Ambulatory Visit (HOSPITAL_BASED_OUTPATIENT_CLINIC_OR_DEPARTMENT_OTHER): Payer: Medicare Other | Attending: Internal Medicine | Admitting: Internal Medicine

## 2020-09-25 DIAGNOSIS — G4733 Obstructive sleep apnea (adult) (pediatric): Secondary | ICD-10-CM | POA: Insufficient documentation

## 2020-09-25 DIAGNOSIS — G4731 Primary central sleep apnea: Secondary | ICD-10-CM | POA: Diagnosis not present

## 2020-09-28 DIAGNOSIS — G4731 Primary central sleep apnea: Secondary | ICD-10-CM | POA: Diagnosis not present

## 2020-09-28 NOTE — Procedures (Signed)
   NAME: Jonathan Strickland DATE OF BIRTH:  [birth year] MEDICAL RECORD NUMBER [id number]  LOCATION: [facility]  PHYSICIAN: Marius Ditch  DATE OF STUDY: 09/25/2020  SLEEP STUDY TYPE: Positive Airway Pressure Titration               REFERRING PHYSICIAN: Marius Ditch, MD  EPWORTH SLEEPINESS SCORE:  NA HEIGHT: 6\' 1"  (185.4 cm)  WEIGHT: 175 lb (79.4 kg)    Body mass index is 23.09 kg/m.  NECK SIZE: 16 in.  CLINICAL INFORMATION The patient was referred to the sleep center for PAP titration. He had a HSAT that showed mild sleep apnea most of which appeared to be centrals. An echocardiogram showed EF was normal.   MEDICATIONS No sleep medicine administered.Marland Kitchen  SLEEP STUDY TECHNIQUE The patient underwent an attended overnight level one polysomnography titration to assess the effects of cpap therapy. The following variables were monitored: EEG (C4-A1, C3-A2, O1-A2, O2-A1), EOG, submental and leg EMG, ECG, oxyhemoglobin saturation by pulse oximetry, thoracic and abdominal respiratory effort belts, nasal/oral airflow by pressure sensor, body position sensor and snoring sensor. CPAP pressure was titrated to eliminate apneas, hypopneas and oxygen desaturation.  TECHNICAL COMMENTS Comments added by Technician: PATIENT TOLERATED THE MASK GOOD Comments added by Scorer: N/A  SLEEP ARCHITECTURE The recording time for the entire night was 362.1 minutes.  The test was initiated at 11:05:10 PM and terminated at 5:07:16 AM. The time in bed was 362.1 minutes. EEG confirmed total sleep time was 262.9 minutes yielding a sleep efficiency of 72.6%%. Sleep onset after lights out was 2.7 minutes with a REM latency of 82.5 minutes. The patient spent 6.1%% of the night in stage N1 sleep, 80.8% % in stage N2 sleep, 0.0%% in stage N3 and 13.1% in REM. The Arousal Index was 3.0/hour.  RESPIRATORY PARAMETERS During the test, there were a total of 48 respiratory disturbances recorded; 43 apneas  ( 5 obstructive, 4 mixed, 34 central), 5 hypopneas and 0 RERAs. The patient was started on a CPAP of 4 and pressure was increased to 10 cm H2O due to continued events. AHI with CPAP 10 was 2 events/hour. He then had a prolonged awakening. When he went back to sleep, he had a few central events and was changed to BPAP. This did not resolve his sleep disordered breathing.   LEG MOVEMENT DATA The periodic limb movement index was 0.0/hour with an associated arousal index of /hour.  CARDIAC DATA The underlying cardiac rhythm was most consistent with sinus rhythm. Mean heart rate was 54.3 during diagnostic portion and N/A during titration portion of study. Additional rhythm abnormalities include None.  IMPRESSIONS - Obstructive and Central Sleep Apnea. Centrals>obstructives - Resolution of most sleep disordered breathing with CPAP 10. This is surprising given that most events looked like central events. However, he did have a long period of wakefulness that may have precluded seeing other events  DIAGNOSIS - Central Sleep Apnea - Obstructive Sleep Apnea  RECOMMENDATIONS - Trial of CPAP 10 cm H2O with a Medium size Fisher[employer]Paykel Full Face Mask Simplus mask and heated humidification. Close follow-up to see if centrals are not adequately treated at home with these settings. However, they did appear to be well treated in the lab  Marius Ditch Sleep specialist, [facility]  ELECTRONICALLY SIGNED ON:  09/28/2020, 8:27 AM [place] PH: [phone]   FX: [phone] [place]

## 2020-10-16 DIAGNOSIS — D649 Anemia, unspecified: Secondary | ICD-10-CM | POA: Diagnosis not present

## 2020-10-16 DIAGNOSIS — E7800 Pure hypercholesterolemia, unspecified: Secondary | ICD-10-CM | POA: Diagnosis not present

## 2020-10-16 DIAGNOSIS — R7989 Other specified abnormal findings of blood chemistry: Secondary | ICD-10-CM | POA: Diagnosis not present

## 2020-10-16 DIAGNOSIS — G4731 Primary central sleep apnea: Secondary | ICD-10-CM | POA: Diagnosis not present

## 2020-10-16 DIAGNOSIS — Z789 Other specified health status: Secondary | ICD-10-CM | POA: Diagnosis not present

## 2020-10-16 DIAGNOSIS — I480 Paroxysmal atrial fibrillation: Secondary | ICD-10-CM | POA: Diagnosis not present

## 2020-10-16 DIAGNOSIS — R0600 Dyspnea, unspecified: Secondary | ICD-10-CM | POA: Diagnosis not present

## 2020-10-16 DIAGNOSIS — R001 Bradycardia, unspecified: Secondary | ICD-10-CM | POA: Diagnosis not present

## 2020-10-20 DIAGNOSIS — R001 Bradycardia, unspecified: Secondary | ICD-10-CM | POA: Diagnosis not present

## 2020-10-20 DIAGNOSIS — I480 Paroxysmal atrial fibrillation: Secondary | ICD-10-CM | POA: Diagnosis not present

## 2020-10-20 DIAGNOSIS — R0600 Dyspnea, unspecified: Secondary | ICD-10-CM | POA: Diagnosis not present

## 2020-12-22 DIAGNOSIS — E7800 Pure hypercholesterolemia, unspecified: Secondary | ICD-10-CM | POA: Diagnosis not present

## 2020-12-22 DIAGNOSIS — H900 Conductive hearing loss, bilateral: Secondary | ICD-10-CM | POA: Diagnosis not present

## 2020-12-22 DIAGNOSIS — G4733 Obstructive sleep apnea (adult) (pediatric): Secondary | ICD-10-CM | POA: Diagnosis not present

## 2020-12-22 DIAGNOSIS — I480 Paroxysmal atrial fibrillation: Secondary | ICD-10-CM | POA: Diagnosis not present

## 2020-12-22 DIAGNOSIS — I700 Atherosclerosis of aorta: Secondary | ICD-10-CM | POA: Diagnosis not present

## 2021-01-05 DIAGNOSIS — Z20822 Contact with and (suspected) exposure to covid-19: Secondary | ICD-10-CM | POA: Diagnosis not present

## 2021-04-06 DIAGNOSIS — Z23 Encounter for immunization: Secondary | ICD-10-CM | POA: Diagnosis not present

## 2021-06-04 DIAGNOSIS — Z789 Other specified health status: Secondary | ICD-10-CM | POA: Diagnosis not present

## 2021-06-04 DIAGNOSIS — R0609 Other forms of dyspnea: Secondary | ICD-10-CM | POA: Diagnosis not present

## 2021-06-04 DIAGNOSIS — G4731 Primary central sleep apnea: Secondary | ICD-10-CM | POA: Diagnosis not present

## 2021-06-04 DIAGNOSIS — E7800 Pure hypercholesterolemia, unspecified: Secondary | ICD-10-CM | POA: Diagnosis not present

## 2021-06-04 DIAGNOSIS — Z136 Encounter for screening for cardiovascular disorders: Secondary | ICD-10-CM | POA: Diagnosis not present

## 2021-06-04 DIAGNOSIS — I480 Paroxysmal atrial fibrillation: Secondary | ICD-10-CM | POA: Diagnosis not present

## 2021-06-16 DIAGNOSIS — D225 Melanocytic nevi of trunk: Secondary | ICD-10-CM | POA: Diagnosis not present

## 2021-06-16 DIAGNOSIS — D2272 Melanocytic nevi of left lower limb, including hip: Secondary | ICD-10-CM | POA: Diagnosis not present

## 2021-06-16 DIAGNOSIS — L718 Other rosacea: Secondary | ICD-10-CM | POA: Diagnosis not present

## 2021-06-16 DIAGNOSIS — L821 Other seborrheic keratosis: Secondary | ICD-10-CM | POA: Diagnosis not present

## 2021-07-14 DIAGNOSIS — I4819 Other persistent atrial fibrillation: Secondary | ICD-10-CM | POA: Diagnosis not present

## 2021-09-25 DIAGNOSIS — H43811 Vitreous degeneration, right eye: Secondary | ICD-10-CM | POA: Diagnosis not present

## 2021-09-25 DIAGNOSIS — H43391 Other vitreous opacities, right eye: Secondary | ICD-10-CM | POA: Diagnosis not present

## 2021-09-25 DIAGNOSIS — H25041 Posterior subcapsular polar age-related cataract, right eye: Secondary | ICD-10-CM | POA: Diagnosis not present

## 2021-09-25 DIAGNOSIS — H25013 Cortical age-related cataract, bilateral: Secondary | ICD-10-CM | POA: Diagnosis not present

## 2021-11-02 DIAGNOSIS — Z20822 Contact with and (suspected) exposure to covid-19: Secondary | ICD-10-CM | POA: Diagnosis not present

## 2021-12-23 DIAGNOSIS — E7800 Pure hypercholesterolemia, unspecified: Secondary | ICD-10-CM | POA: Diagnosis not present

## 2021-12-23 DIAGNOSIS — I482 Chronic atrial fibrillation, unspecified: Secondary | ICD-10-CM | POA: Diagnosis not present

## 2021-12-23 DIAGNOSIS — I700 Atherosclerosis of aorta: Secondary | ICD-10-CM | POA: Diagnosis not present

## 2021-12-23 DIAGNOSIS — Z79899 Other long term (current) drug therapy: Secondary | ICD-10-CM | POA: Diagnosis not present

## 2021-12-23 DIAGNOSIS — G4733 Obstructive sleep apnea (adult) (pediatric): Secondary | ICD-10-CM | POA: Diagnosis not present

## 2021-12-23 DIAGNOSIS — Z0000 Encounter for general adult medical examination without abnormal findings: Secondary | ICD-10-CM | POA: Diagnosis not present

## 2021-12-23 DIAGNOSIS — Z86010 Personal history of colonic polyps: Secondary | ICD-10-CM | POA: Diagnosis not present

## 2022-04-25 DIAGNOSIS — Z23 Encounter for immunization: Secondary | ICD-10-CM | POA: Diagnosis not present

## 2022-06-10 DIAGNOSIS — D1801 Hemangioma of skin and subcutaneous tissue: Secondary | ICD-10-CM | POA: Diagnosis not present

## 2022-06-10 DIAGNOSIS — E785 Hyperlipidemia, unspecified: Secondary | ICD-10-CM | POA: Diagnosis not present

## 2022-06-10 DIAGNOSIS — Z85828 Personal history of other malignant neoplasm of skin: Secondary | ICD-10-CM | POA: Diagnosis not present

## 2022-06-10 DIAGNOSIS — L821 Other seborrheic keratosis: Secondary | ICD-10-CM | POA: Diagnosis not present

## 2022-06-10 DIAGNOSIS — D2271 Melanocytic nevi of right lower limb, including hip: Secondary | ICD-10-CM | POA: Diagnosis not present

## 2022-06-10 DIAGNOSIS — R0609 Other forms of dyspnea: Secondary | ICD-10-CM | POA: Diagnosis not present

## 2022-06-10 DIAGNOSIS — D225 Melanocytic nevi of trunk: Secondary | ICD-10-CM | POA: Diagnosis not present

## 2022-06-10 DIAGNOSIS — I480 Paroxysmal atrial fibrillation: Secondary | ICD-10-CM | POA: Diagnosis not present

## 2022-06-10 DIAGNOSIS — Z789 Other specified health status: Secondary | ICD-10-CM | POA: Diagnosis not present

## 2022-06-10 DIAGNOSIS — E7800 Pure hypercholesterolemia, unspecified: Secondary | ICD-10-CM | POA: Diagnosis not present

## 2022-06-10 DIAGNOSIS — D2272 Melanocytic nevi of left lower limb, including hip: Secondary | ICD-10-CM | POA: Diagnosis not present

## 2022-06-10 DIAGNOSIS — D2361 Other benign neoplasm of skin of right upper limb, including shoulder: Secondary | ICD-10-CM | POA: Diagnosis not present

## 2022-06-10 DIAGNOSIS — L738 Other specified follicular disorders: Secondary | ICD-10-CM | POA: Diagnosis not present

## 2022-06-10 DIAGNOSIS — D2261 Melanocytic nevi of right upper limb, including shoulder: Secondary | ICD-10-CM | POA: Diagnosis not present

## 2022-06-17 DIAGNOSIS — I708 Atherosclerosis of other arteries: Secondary | ICD-10-CM | POA: Diagnosis not present

## 2022-06-17 DIAGNOSIS — R0609 Other forms of dyspnea: Secondary | ICD-10-CM | POA: Diagnosis not present

## 2022-06-17 DIAGNOSIS — I480 Paroxysmal atrial fibrillation: Secondary | ICD-10-CM | POA: Diagnosis not present

## 2022-06-17 DIAGNOSIS — Z136 Encounter for screening for cardiovascular disorders: Secondary | ICD-10-CM | POA: Diagnosis not present

## 2022-09-13 DIAGNOSIS — H1089 Other conjunctivitis: Secondary | ICD-10-CM | POA: Diagnosis not present

## 2022-09-13 DIAGNOSIS — J069 Acute upper respiratory infection, unspecified: Secondary | ICD-10-CM | POA: Diagnosis not present

## 2022-09-27 DIAGNOSIS — H25041 Posterior subcapsular polar age-related cataract, right eye: Secondary | ICD-10-CM | POA: Diagnosis not present

## 2022-09-27 DIAGNOSIS — H43391 Other vitreous opacities, right eye: Secondary | ICD-10-CM | POA: Diagnosis not present

## 2022-09-27 DIAGNOSIS — H25013 Cortical age-related cataract, bilateral: Secondary | ICD-10-CM | POA: Diagnosis not present

## 2022-09-27 DIAGNOSIS — H43811 Vitreous degeneration, right eye: Secondary | ICD-10-CM | POA: Diagnosis not present

## 2022-12-27 DIAGNOSIS — L720 Epidermal cyst: Secondary | ICD-10-CM | POA: Diagnosis not present

## 2022-12-29 DIAGNOSIS — I482 Chronic atrial fibrillation, unspecified: Secondary | ICD-10-CM | POA: Diagnosis not present

## 2022-12-29 DIAGNOSIS — G4733 Obstructive sleep apnea (adult) (pediatric): Secondary | ICD-10-CM | POA: Diagnosis not present

## 2022-12-29 DIAGNOSIS — Z86010 Personal history of colonic polyps: Secondary | ICD-10-CM | POA: Diagnosis not present

## 2022-12-29 DIAGNOSIS — Z79899 Other long term (current) drug therapy: Secondary | ICD-10-CM | POA: Diagnosis not present

## 2022-12-29 DIAGNOSIS — Z0000 Encounter for general adult medical examination without abnormal findings: Secondary | ICD-10-CM | POA: Diagnosis not present

## 2022-12-29 DIAGNOSIS — I700 Atherosclerosis of aorta: Secondary | ICD-10-CM | POA: Diagnosis not present

## 2022-12-29 DIAGNOSIS — Z125 Encounter for screening for malignant neoplasm of prostate: Secondary | ICD-10-CM | POA: Diagnosis not present

## 2022-12-29 DIAGNOSIS — D6869 Other thrombophilia: Secondary | ICD-10-CM | POA: Diagnosis not present

## 2022-12-29 DIAGNOSIS — E7800 Pure hypercholesterolemia, unspecified: Secondary | ICD-10-CM | POA: Diagnosis not present

## 2023-05-16 DIAGNOSIS — R972 Elevated prostate specific antigen [PSA]: Secondary | ICD-10-CM | POA: Diagnosis not present

## 2023-06-07 DIAGNOSIS — J101 Influenza due to other identified influenza virus with other respiratory manifestations: Secondary | ICD-10-CM | POA: Diagnosis not present

## 2023-06-07 DIAGNOSIS — R52 Pain, unspecified: Secondary | ICD-10-CM | POA: Diagnosis not present

## 2023-06-07 DIAGNOSIS — Z03818 Encounter for observation for suspected exposure to other biological agents ruled out: Secondary | ICD-10-CM | POA: Diagnosis not present

## 2023-06-07 DIAGNOSIS — J018 Other acute sinusitis: Secondary | ICD-10-CM | POA: Diagnosis not present

## 2023-06-07 DIAGNOSIS — R051 Acute cough: Secondary | ICD-10-CM | POA: Diagnosis not present

## 2023-06-07 DIAGNOSIS — R5383 Other fatigue: Secondary | ICD-10-CM | POA: Diagnosis not present

## 2023-06-13 DIAGNOSIS — D2271 Melanocytic nevi of right lower limb, including hip: Secondary | ICD-10-CM | POA: Diagnosis not present

## 2023-06-13 DIAGNOSIS — L821 Other seborrheic keratosis: Secondary | ICD-10-CM | POA: Diagnosis not present

## 2023-06-13 DIAGNOSIS — L565 Disseminated superficial actinic porokeratosis (DSAP): Secondary | ICD-10-CM | POA: Diagnosis not present

## 2023-06-13 DIAGNOSIS — D2272 Melanocytic nevi of left lower limb, including hip: Secondary | ICD-10-CM | POA: Diagnosis not present

## 2023-06-13 DIAGNOSIS — D2361 Other benign neoplasm of skin of right upper limb, including shoulder: Secondary | ICD-10-CM | POA: Diagnosis not present

## 2023-06-13 DIAGNOSIS — D2261 Melanocytic nevi of right upper limb, including shoulder: Secondary | ICD-10-CM | POA: Diagnosis not present

## 2023-06-13 DIAGNOSIS — L738 Other specified follicular disorders: Secondary | ICD-10-CM | POA: Diagnosis not present

## 2023-06-13 DIAGNOSIS — D225 Melanocytic nevi of trunk: Secondary | ICD-10-CM | POA: Diagnosis not present

## 2023-06-13 DIAGNOSIS — D2262 Melanocytic nevi of left upper limb, including shoulder: Secondary | ICD-10-CM | POA: Diagnosis not present

## 2023-06-13 DIAGNOSIS — D2372 Other benign neoplasm of skin of left lower limb, including hip: Secondary | ICD-10-CM | POA: Diagnosis not present

## 2023-07-29 DIAGNOSIS — J029 Acute pharyngitis, unspecified: Secondary | ICD-10-CM | POA: Diagnosis not present

## 2023-07-29 DIAGNOSIS — R062 Wheezing: Secondary | ICD-10-CM | POA: Diagnosis not present

## 2023-07-29 DIAGNOSIS — J069 Acute upper respiratory infection, unspecified: Secondary | ICD-10-CM | POA: Diagnosis not present

## 2023-10-10 DIAGNOSIS — H43811 Vitreous degeneration, right eye: Secondary | ICD-10-CM | POA: Diagnosis not present

## 2023-10-10 DIAGNOSIS — H25013 Cortical age-related cataract, bilateral: Secondary | ICD-10-CM | POA: Diagnosis not present

## 2023-10-10 DIAGNOSIS — H25011 Cortical age-related cataract, right eye: Secondary | ICD-10-CM | POA: Diagnosis not present

## 2023-11-10 DIAGNOSIS — Z789 Other specified health status: Secondary | ICD-10-CM | POA: Diagnosis not present

## 2023-11-10 DIAGNOSIS — R0609 Other forms of dyspnea: Secondary | ICD-10-CM | POA: Diagnosis not present

## 2023-11-10 DIAGNOSIS — E7800 Pure hypercholesterolemia, unspecified: Secondary | ICD-10-CM | POA: Diagnosis not present

## 2023-11-10 DIAGNOSIS — I480 Paroxysmal atrial fibrillation: Secondary | ICD-10-CM | POA: Diagnosis not present

## 2024-02-10 DIAGNOSIS — E7800 Pure hypercholesterolemia, unspecified: Secondary | ICD-10-CM | POA: Diagnosis not present

## 2024-02-10 DIAGNOSIS — G4733 Obstructive sleep apnea (adult) (pediatric): Secondary | ICD-10-CM | POA: Diagnosis not present

## 2024-02-10 DIAGNOSIS — I482 Chronic atrial fibrillation, unspecified: Secondary | ICD-10-CM | POA: Diagnosis not present

## 2024-02-10 DIAGNOSIS — D6869 Other thrombophilia: Secondary | ICD-10-CM | POA: Diagnosis not present

## 2024-02-10 DIAGNOSIS — Z79899 Other long term (current) drug therapy: Secondary | ICD-10-CM | POA: Diagnosis not present

## 2024-02-10 DIAGNOSIS — Z1211 Encounter for screening for malignant neoplasm of colon: Secondary | ICD-10-CM | POA: Diagnosis not present

## 2024-02-10 DIAGNOSIS — F5101 Primary insomnia: Secondary | ICD-10-CM | POA: Diagnosis not present

## 2024-02-10 DIAGNOSIS — Z125 Encounter for screening for malignant neoplasm of prostate: Secondary | ICD-10-CM | POA: Diagnosis not present

## 2024-02-10 DIAGNOSIS — Z860101 Personal history of adenomatous and serrated colon polyps: Secondary | ICD-10-CM | POA: Diagnosis not present

## 2024-02-10 DIAGNOSIS — Z0000 Encounter for general adult medical examination without abnormal findings: Secondary | ICD-10-CM | POA: Diagnosis not present

## 2024-02-17 DIAGNOSIS — Z23 Encounter for immunization: Secondary | ICD-10-CM | POA: Diagnosis not present

## 2024-04-05 DIAGNOSIS — R972 Elevated prostate specific antigen [PSA]: Secondary | ICD-10-CM | POA: Diagnosis not present

## 2024-06-12 DIAGNOSIS — L738 Other specified follicular disorders: Secondary | ICD-10-CM | POA: Diagnosis not present

## 2024-06-12 DIAGNOSIS — L821 Other seborrheic keratosis: Secondary | ICD-10-CM | POA: Diagnosis not present

## 2024-06-12 DIAGNOSIS — D2272 Melanocytic nevi of left lower limb, including hip: Secondary | ICD-10-CM | POA: Diagnosis not present

## 2024-06-12 DIAGNOSIS — D2271 Melanocytic nevi of right lower limb, including hip: Secondary | ICD-10-CM | POA: Diagnosis not present

## 2024-06-12 DIAGNOSIS — L814 Other melanin hyperpigmentation: Secondary | ICD-10-CM | POA: Diagnosis not present

## 2024-06-12 DIAGNOSIS — D225 Melanocytic nevi of trunk: Secondary | ICD-10-CM | POA: Diagnosis not present

## 2024-06-12 DIAGNOSIS — D2262 Melanocytic nevi of left upper limb, including shoulder: Secondary | ICD-10-CM | POA: Diagnosis not present

## 2024-06-12 DIAGNOSIS — L720 Epidermal cyst: Secondary | ICD-10-CM | POA: Diagnosis not present

## 2024-06-12 DIAGNOSIS — D2261 Melanocytic nevi of right upper limb, including shoulder: Secondary | ICD-10-CM | POA: Diagnosis not present
